# Patient Record
Sex: Male | Born: 1970 | Race: White | Hispanic: No | Marital: Married | Smoking: Never smoker
Health system: Southern US, Community
[De-identification: ages and names within clinical notes are randomized; demographics above are authoritative.]

## PROBLEM LIST (undated history)

## (undated) DIAGNOSIS — Z85820 Personal history of malignant melanoma of skin: Secondary | ICD-10-CM

## (undated) DIAGNOSIS — M109 Gout, unspecified: Secondary | ICD-10-CM

## (undated) DIAGNOSIS — M1990 Unspecified osteoarthritis, unspecified site: Secondary | ICD-10-CM

## (undated) DIAGNOSIS — J309 Allergic rhinitis, unspecified: Secondary | ICD-10-CM

## (undated) DIAGNOSIS — N201 Calculus of ureter: Secondary | ICD-10-CM

## (undated) DIAGNOSIS — Z973 Presence of spectacles and contact lenses: Secondary | ICD-10-CM

## (undated) DIAGNOSIS — I10 Essential (primary) hypertension: Secondary | ICD-10-CM

## (undated) DIAGNOSIS — Z87442 Personal history of urinary calculi: Secondary | ICD-10-CM

## (undated) DIAGNOSIS — J4520 Mild intermittent asthma, uncomplicated: Secondary | ICD-10-CM

## (undated) DIAGNOSIS — E785 Hyperlipidemia, unspecified: Secondary | ICD-10-CM

## (undated) DIAGNOSIS — K219 Gastro-esophageal reflux disease without esophagitis: Secondary | ICD-10-CM

## (undated) DIAGNOSIS — R3915 Urgency of urination: Secondary | ICD-10-CM

## (undated) HISTORY — DX: Gastro-esophageal reflux disease without esophagitis: K21.9

## (undated) HISTORY — DX: Gout, unspecified: M10.9

## (undated) HISTORY — DX: Essential (primary) hypertension: I10

## (undated) HISTORY — PX: KNEE ARTHROSCOPY: SUR90

---

## 1990-11-10 HISTORY — PX: OTHER SURGICAL HISTORY: SHX169

## 1999-12-26 ENCOUNTER — Ambulatory Visit (HOSPITAL_COMMUNITY): Admission: RE | Admit: 1999-12-26 | Discharge: 1999-12-26 | Payer: Self-pay | Admitting: Neurosurgery

## 1999-12-26 ENCOUNTER — Encounter: Payer: Self-pay | Admitting: Neurosurgery

## 2000-01-09 ENCOUNTER — Ambulatory Visit (HOSPITAL_COMMUNITY): Admission: RE | Admit: 2000-01-09 | Discharge: 2000-01-09 | Payer: Self-pay | Admitting: Neurosurgery

## 2000-01-09 ENCOUNTER — Encounter: Payer: Self-pay | Admitting: Neurosurgery

## 2000-01-27 ENCOUNTER — Encounter: Payer: Self-pay | Admitting: Neurosurgery

## 2000-01-27 ENCOUNTER — Ambulatory Visit (HOSPITAL_COMMUNITY): Admission: RE | Admit: 2000-01-27 | Discharge: 2000-01-27 | Payer: Self-pay | Admitting: Neurosurgery

## 2005-11-10 DIAGNOSIS — Z85820 Personal history of malignant melanoma of skin: Secondary | ICD-10-CM

## 2005-11-10 HISTORY — DX: Personal history of malignant melanoma of skin: Z85.820

## 2006-06-26 ENCOUNTER — Encounter (INDEPENDENT_AMBULATORY_CARE_PROVIDER_SITE_OTHER): Payer: Self-pay | Admitting: Pathology

## 2006-06-26 ENCOUNTER — Ambulatory Visit (HOSPITAL_BASED_OUTPATIENT_CLINIC_OR_DEPARTMENT_OTHER): Admission: RE | Admit: 2006-06-26 | Discharge: 2006-06-26 | Payer: Self-pay | Admitting: General Surgery

## 2006-06-26 HISTORY — PX: OTHER SURGICAL HISTORY: SHX169

## 2006-10-13 ENCOUNTER — Ambulatory Visit: Payer: Self-pay | Admitting: Family Medicine

## 2008-10-23 ENCOUNTER — Encounter: Admission: RE | Admit: 2008-10-23 | Discharge: 2008-10-23 | Payer: Self-pay | Admitting: Family Medicine

## 2008-11-06 ENCOUNTER — Ambulatory Visit: Payer: Self-pay | Admitting: Pulmonary Disease

## 2008-11-06 DIAGNOSIS — Z85820 Personal history of malignant melanoma of skin: Secondary | ICD-10-CM | POA: Insufficient documentation

## 2008-11-06 DIAGNOSIS — I10 Essential (primary) hypertension: Secondary | ICD-10-CM | POA: Insufficient documentation

## 2008-11-06 DIAGNOSIS — E785 Hyperlipidemia, unspecified: Secondary | ICD-10-CM

## 2008-11-06 DIAGNOSIS — R05 Cough: Secondary | ICD-10-CM

## 2008-11-06 DIAGNOSIS — R059 Cough, unspecified: Secondary | ICD-10-CM | POA: Insufficient documentation

## 2009-01-08 ENCOUNTER — Telehealth (INDEPENDENT_AMBULATORY_CARE_PROVIDER_SITE_OTHER): Payer: Self-pay | Admitting: *Deleted

## 2009-06-08 ENCOUNTER — Emergency Department (HOSPITAL_COMMUNITY): Admission: EM | Admit: 2009-06-08 | Discharge: 2009-06-08 | Payer: Self-pay | Admitting: Family Medicine

## 2009-11-06 ENCOUNTER — Encounter: Admission: RE | Admit: 2009-11-06 | Discharge: 2009-11-06 | Payer: Self-pay | Admitting: Family Medicine

## 2011-01-15 ENCOUNTER — Other Ambulatory Visit: Payer: Self-pay | Admitting: Family Medicine

## 2011-01-15 ENCOUNTER — Ambulatory Visit
Admission: RE | Admit: 2011-01-15 | Discharge: 2011-01-15 | Disposition: A | Discharge status: Home / Self Care | Payer: BC Managed Care – PPO | Source: Ambulatory Visit | Attending: Family Medicine | Admitting: Family Medicine

## 2011-01-15 DIAGNOSIS — C439 Malignant melanoma of skin, unspecified: Secondary | ICD-10-CM

## 2011-03-28 NOTE — Op Note (Signed)
NAME:  Isaac Summers, Isaac Summers NO.:  [id number]   MEDICAL RECORD NO.:  [id number]          PATIENT TYPE:  AMB   LOCATION:  DSC                          FACILITY:  [facility]   PHYSICIAN:  Angelia Mould. Derrell Lolling, M.D.DATE OF BIRTH:  1971-05-27   DATE OF PROCEDURE:  06/26/2006  DATE OF DISCHARGE:                                 OPERATIVE REPORT   PREOPERATIVE DIAGNOSIS:  Melanoma, left chest wall.   POSTOPERATIVE DIAGNOSIS:  Melanoma, left chest wall.   OPERATION PERFORMED:  Wide local excision, melanoma, left chest wall.   SURGEON:  Angelia Mould. Derrell Lolling, M.D.   OPERATIVE INDICATIONS:  This is a 40 year old white male who noticed a  pigmented nevus on his left upper anterior chest wall just below the deltoid  muscle.  Dr. Tresa Res performed a shave biopsy on this recently, and  this revealed a superficial spreading melanoma, Breslow's measurement 0.50  mm, no ulceration, involved peripheral margins were noted.  Clark's level  II.  He has been evaluated in the office.  There is no adenopathy or signs  of any satellitosis.  He is brought to the operating room for wide local  excision.   OPERATIVE TECHNIQUE:  The patient was placed supine on the operating table  with his left arm at his side.  He was monitored and sedated by anesthesia  department.  The left anterior chest wall and shoulder were prepped and  draped in sterile fashion.   I observed the 1-cm shallow ulcer just below the left deltoid muscle.  I  measured margins of 1.4 cm in all directions and marked this.  I then marked  an oblique elliptical incision to encompass the intended margins.  Local  infiltration anesthetic with 1% Xylocaine with epinephrine was used.  The  oblique elliptical incision was made with a knife, and dissection was  carried down through subcutaneous tissue all the way to the muscle fascia  using the cautery.  The specimen was marked with silk sutures to orient the  pathologist to the margins,  and the specimen was sent for routine histology.  Hemostasis was excellent and achieved with electrocautery.  The subcutaneous  tissue was closed with 5 interrupted sutures of 3-0 Vicryl and skin closed  with running subcuticular suture of 4-0 Monocryl and Steri-Strips.  Clean  bandages were placed and the patient taken recovery room in stable  condition.  Estimated blood loss was about 5 mL.  Complications none.  Sponge, needle and instrument counts were correct.      Angelia Mould. Derrell Lolling, M.D.  Electronically Signed     HMI/MEDQ  D:  06/26/2006  T:  06/26/2006  Job:  [id number]

## 2011-07-22 ENCOUNTER — Other Ambulatory Visit: Payer: Self-pay | Admitting: Physician Assistant

## 2012-10-06 ENCOUNTER — Ambulatory Visit
Admission: RE | Admit: 2012-10-06 | Discharge: 2012-10-06 | Disposition: A | Discharge status: Home / Self Care | Payer: BC Managed Care – PPO | Source: Ambulatory Visit | Attending: Family Medicine | Admitting: Family Medicine

## 2012-10-06 ENCOUNTER — Other Ambulatory Visit: Payer: Self-pay | Admitting: Family Medicine

## 2012-10-06 DIAGNOSIS — Z0000 Encounter for general adult medical examination without abnormal findings: Secondary | ICD-10-CM

## 2014-05-19 ENCOUNTER — Other Ambulatory Visit: Payer: Self-pay | Admitting: Physician Assistant

## 2014-10-04 ENCOUNTER — Ambulatory Visit
Admission: RE | Admit: 2014-10-04 | Discharge: 2014-10-04 | Disposition: A | Discharge status: Home / Self Care | Payer: BC Managed Care – PPO | Source: Ambulatory Visit | Attending: Family Medicine | Admitting: Family Medicine

## 2014-10-04 ENCOUNTER — Other Ambulatory Visit: Payer: Self-pay | Admitting: Family Medicine

## 2014-10-04 DIAGNOSIS — Z85820 Personal history of malignant melanoma of skin: Secondary | ICD-10-CM

## 2017-03-12 ENCOUNTER — Ambulatory Visit (INDEPENDENT_AMBULATORY_CARE_PROVIDER_SITE_OTHER): Payer: Managed Care, Other (non HMO) | Admitting: Sports Medicine

## 2017-03-12 ENCOUNTER — Encounter (INDEPENDENT_AMBULATORY_CARE_PROVIDER_SITE_OTHER): Payer: Self-pay

## 2017-03-12 ENCOUNTER — Encounter: Payer: Self-pay | Admitting: Sports Medicine

## 2017-03-12 DIAGNOSIS — M25579 Pain in unspecified ankle and joints of unspecified foot: Secondary | ICD-10-CM

## 2017-03-12 NOTE — Assessment & Plan Note (Signed)
Patient was fitted for a : standard, cushioned, semi-rigid orthotic. The orthotic was heated and afterward the patient stood on the orthotic blank positioned on the orthotic stand. The patient was positioned in subtalar neutral position and 10 degrees of ankle dorsiflexion in a weight bearing stance. After completion of molding, a stable base was applied to the orthotic blank. The blank was ground to a stable position for weight bearing. Size: 11 red EVA Base: med. density blue EVA Posting: none Additional orthotic padding:  None  I spent30 minutes with this patient. Over 50% of visit was spend in counseling and coordination of care for problems with use of orthotics for chronic foot pain.

## 2017-03-12 NOTE — Progress Notes (Signed)
CC: Bilat foot pain  Patient had hx of bilateral foot pain All along arch  Hurt with golf or too much walking Put into orthotics 5 to 6 years ago These really help Those orthotics are wearing out Tried to play without orthotics and pain returned  Past Hx HTN and HLD Remote hx of melanoma  ROS No foot swelling No numbness  PE Pleasant M in NAD BP 139/87   Ht 5\' 8"  (1.727 m)   Wt 212 lb (96.2 kg)   BMI 32.23 kg/m   Bilat feet show loss of long arch Standing pronation at mid foot Walking shows biklat pronation No TTP Mild loss of transverse arch No abn. Calluses

## 2017-06-19 ENCOUNTER — Observation Stay (HOSPITAL_COMMUNITY)
Admission: AD | Admit: 2017-06-19 | Discharge: 2017-06-21 | Disposition: A | Discharge status: Home / Self Care | Payer: Managed Care, Other (non HMO) | Source: Ambulatory Visit | Attending: Urology | Admitting: Urology

## 2017-06-19 ENCOUNTER — Inpatient Hospital Stay (HOSPITAL_COMMUNITY): Payer: Managed Care, Other (non HMO) | Admitting: Anesthesiology

## 2017-06-19 ENCOUNTER — Encounter (HOSPITAL_COMMUNITY): Payer: Self-pay | Admitting: *Deleted

## 2017-06-19 ENCOUNTER — Encounter (HOSPITAL_COMMUNITY)
Admission: AD | Disposition: A | Discharge status: Home / Self Care | Payer: Self-pay | Source: Ambulatory Visit | Attending: Urology

## 2017-06-19 ENCOUNTER — Other Ambulatory Visit: Payer: Self-pay | Admitting: Urology

## 2017-06-19 ENCOUNTER — Inpatient Hospital Stay (HOSPITAL_COMMUNITY): Payer: Managed Care, Other (non HMO)

## 2017-06-19 DIAGNOSIS — Z6832 Body mass index (BMI) 32.0-32.9, adult: Secondary | ICD-10-CM | POA: Insufficient documentation

## 2017-06-19 DIAGNOSIS — Z85820 Personal history of malignant melanoma of skin: Secondary | ICD-10-CM | POA: Insufficient documentation

## 2017-06-19 DIAGNOSIS — N132 Hydronephrosis with renal and ureteral calculous obstruction: Principal | ICD-10-CM | POA: Insufficient documentation

## 2017-06-19 DIAGNOSIS — N201 Calculus of ureter: Secondary | ICD-10-CM | POA: Diagnosis present

## 2017-06-19 DIAGNOSIS — R000 Tachycardia, unspecified: Secondary | ICD-10-CM | POA: Insufficient documentation

## 2017-06-19 DIAGNOSIS — Z7982 Long term (current) use of aspirin: Secondary | ICD-10-CM | POA: Diagnosis not present

## 2017-06-19 DIAGNOSIS — M109 Gout, unspecified: Secondary | ICD-10-CM | POA: Diagnosis not present

## 2017-06-19 DIAGNOSIS — E785 Hyperlipidemia, unspecified: Secondary | ICD-10-CM | POA: Diagnosis not present

## 2017-06-19 DIAGNOSIS — N390 Urinary tract infection, site not specified: Secondary | ICD-10-CM | POA: Insufficient documentation

## 2017-06-19 DIAGNOSIS — I10 Essential (primary) hypertension: Secondary | ICD-10-CM | POA: Diagnosis not present

## 2017-06-19 DIAGNOSIS — Z79899 Other long term (current) drug therapy: Secondary | ICD-10-CM | POA: Insufficient documentation

## 2017-06-19 DIAGNOSIS — Z8249 Family history of ischemic heart disease and other diseases of the circulatory system: Secondary | ICD-10-CM | POA: Insufficient documentation

## 2017-06-19 DIAGNOSIS — K219 Gastro-esophageal reflux disease without esophagitis: Secondary | ICD-10-CM | POA: Insufficient documentation

## 2017-06-19 HISTORY — PX: CYSTOSCOPY W/ URETERAL STENT PLACEMENT: SHX1429

## 2017-06-19 LAB — BASIC METABOLIC PANEL
ANION GAP: 11 (ref 5–15)
BUN: 18 mg/dL (ref 6–20)
CALCIUM: 8.6 mg/dL — AB (ref 8.9–10.3)
CO2: 25 mmol/L (ref 22–32)
Chloride: 101 mmol/L (ref 101–111)
Creatinine, Ser: 1.81 mg/dL — ABNORMAL HIGH (ref 0.61–1.24)
GFR calc Af Amer: 50 mL/min — ABNORMAL LOW (ref >60)
GFR, EST NON AFRICAN AMERICAN: 43 mL/min — AB (ref >60)
Glucose, Bld: 111 mg/dL — ABNORMAL HIGH (ref 65–99)
Potassium: 4.2 mmol/L (ref 3.5–5.1)
Sodium: 137 mmol/L (ref 135–145)

## 2017-06-19 LAB — CBC
HCT: 36.4 % — ABNORMAL LOW (ref 39.0–52.0)
Hemoglobin: 12.4 g/dL — ABNORMAL LOW (ref 13.0–17.0)
MCH: 31.2 pg (ref 26.0–34.0)
MCHC: 34.1 g/dL (ref 30.0–36.0)
MCV: 91.5 fL (ref 78.0–100.0)
PLATELETS: 152 10*3/uL (ref 150–400)
RBC: 3.98 MIL/uL — ABNORMAL LOW (ref 4.22–5.81)
RDW: 13.6 % (ref 11.5–15.5)
WBC: 7.9 10*3/uL (ref 4.0–10.5)

## 2017-06-19 SURGERY — CYSTOSCOPY, WITH RETROGRADE PYELOGRAM AND URETERAL STENT INSERTION
Anesthesia: General | Laterality: Left

## 2017-06-19 MED ORDER — FENTANYL CITRATE (PF) 100 MCG/2ML IJ SOLN
25.0000 ug | INTRAMUSCULAR | Status: DC | PRN
  Administered 2017-06-19: 25 ug via INTRAVENOUS

## 2017-06-19 MED ORDER — FENTANYL CITRATE (PF) 100 MCG/2ML IJ SOLN
INTRAMUSCULAR | Status: AC
  Filled 2017-06-19: qty 2

## 2017-06-19 MED ORDER — DEXTROSE 5 % IV SOLN
INTRAVENOUS | Status: AC
  Filled 2017-06-19: qty 2

## 2017-06-19 MED ORDER — DEXAMETHASONE SODIUM PHOSPHATE 10 MG/ML IJ SOLN
INTRAMUSCULAR | Status: AC
  Filled 2017-06-19: qty 1

## 2017-06-19 MED ORDER — LACTATED RINGERS IV SOLN
INTRAVENOUS | Status: DC | PRN
  Administered 2017-06-19 (×2): via INTRAVENOUS

## 2017-06-19 MED ORDER — ONDANSETRON HCL 4 MG/2ML IJ SOLN: 4.0000 mg | INTRAMUSCULAR | Status: DC | PRN

## 2017-06-19 MED ORDER — PROPOFOL 10 MG/ML IV BOLUS
INTRAVENOUS | Status: DC | PRN
  Administered 2017-06-19: 200 mg via INTRAVENOUS

## 2017-06-19 MED ORDER — ZOLPIDEM TARTRATE 5 MG PO TABS
5.0000 mg | ORAL_TABLET | Freq: Every evening | ORAL | Status: DC | PRN
  Administered 2017-06-19 – 2017-06-20 (×2): 5 mg via ORAL
  Filled 2017-06-19 (×3): qty 1

## 2017-06-19 MED ORDER — FENTANYL CITRATE (PF) 100 MCG/2ML IJ SOLN
INTRAMUSCULAR | Status: DC | PRN
  Administered 2017-06-19 (×2): 50 ug via INTRAVENOUS

## 2017-06-19 MED ORDER — SERTRALINE HCL 50 MG PO TABS
50.0000 mg | ORAL_TABLET | Freq: Every day | ORAL | Status: DC
  Administered 2017-06-20 – 2017-06-21 (×2): 50 mg via ORAL
  Filled 2017-06-19 (×2): qty 1

## 2017-06-19 MED ORDER — MIDAZOLAM HCL 5 MG/5ML IJ SOLN
INTRAMUSCULAR | Status: DC | PRN
  Administered 2017-06-19: 2 mg via INTRAVENOUS

## 2017-06-19 MED ORDER — CEFAZOLIN SODIUM-DEXTROSE 2-4 GM/100ML-% IV SOLN
2.0000 g | INTRAVENOUS | Status: AC
  Administered 2017-06-19: 2 g via INTRAVENOUS
  Filled 2017-06-19: qty 100

## 2017-06-19 MED ORDER — FEBUXOSTAT 40 MG PO TABS
40.0000 mg | ORAL_TABLET | Freq: Every day | ORAL | Status: DC
  Administered 2017-06-20 – 2017-06-21 (×2): 40 mg via ORAL
  Filled 2017-06-19 (×2): qty 1

## 2017-06-19 MED ORDER — LIDOCAINE 2% (20 MG/ML) 5 ML SYRINGE
INTRAMUSCULAR | Status: DC | PRN
  Administered 2017-06-19: 100 mg via INTRAVENOUS

## 2017-06-19 MED ORDER — DEXTROSE 5 % IV SOLN
2.0000 g | Freq: Once | INTRAVENOUS | Status: AC
  Administered 2017-06-19: 2 g via INTRAVENOUS

## 2017-06-19 MED ORDER — MIDAZOLAM HCL 2 MG/2ML IJ SOLN
INTRAMUSCULAR | Status: AC
  Filled 2017-06-19: qty 2

## 2017-06-19 MED ORDER — LACTATED RINGERS IV SOLN: INTRAVENOUS | Status: DC

## 2017-06-19 MED ORDER — DIPHENHYDRAMINE HCL 50 MG/ML IJ SOLN: 12.5000 mg | Freq: Four times a day (QID) | INTRAMUSCULAR | Status: DC | PRN

## 2017-06-19 MED ORDER — PROPOFOL 10 MG/ML IV BOLUS
INTRAVENOUS | Status: AC
  Filled 2017-06-19: qty 20

## 2017-06-19 MED ORDER — LACTATED RINGERS IV SOLN
Freq: Once | INTRAVENOUS | Status: AC
  Administered 2017-06-19: 17:00:00 via INTRAVENOUS

## 2017-06-19 MED ORDER — OXYCODONE-ACETAMINOPHEN 5-325 MG PO TABS: 1.0000 | ORAL_TABLET | ORAL | Status: DC | PRN

## 2017-06-19 MED ORDER — PHENYLEPHRINE 40 MCG/ML (10ML) SYRINGE FOR IV PUSH (FOR BLOOD PRESSURE SUPPORT)
PREFILLED_SYRINGE | INTRAVENOUS | Status: AC
  Filled 2017-06-19: qty 10

## 2017-06-19 MED ORDER — BELLADONNA-OPIUM 16.2-30 MG RE SUPP
1.0000 | Freq: Four times a day (QID) | RECTAL | Status: DC | PRN
  Administered 2017-06-19: 1 via RECTAL
  Filled 2017-06-19 (×2): qty 1

## 2017-06-19 MED ORDER — ALBUTEROL SULFATE (2.5 MG/3ML) 0.083% IN NEBU: 3.0000 mL | INHALATION_SOLUTION | Freq: Four times a day (QID) | RESPIRATORY_TRACT | Status: DC | PRN

## 2017-06-19 MED ORDER — SODIUM CHLORIDE 0.9 % IV SOLN
INTRAVENOUS | Status: DC
  Administered 2017-06-19 – 2017-06-20 (×2): via INTRAVENOUS

## 2017-06-19 MED ORDER — IRBESARTAN 75 MG PO TABS
37.5000 mg | ORAL_TABLET | Freq: Every day | ORAL | Status: DC
  Administered 2017-06-20 – 2017-06-21 (×2): 37.5 mg via ORAL
  Filled 2017-06-19 (×2): qty 0.5

## 2017-06-19 MED ORDER — TAMSULOSIN HCL 0.4 MG PO CAPS
0.4000 mg | ORAL_CAPSULE | Freq: Every day | ORAL | Status: DC
  Administered 2017-06-20 – 2017-06-21 (×2): 0.4 mg via ORAL
  Filled 2017-06-19 (×2): qty 1

## 2017-06-19 MED ORDER — ONDANSETRON HCL 4 MG/2ML IJ SOLN
INTRAMUSCULAR | Status: DC | PRN
  Administered 2017-06-19: 4 mg via INTRAVENOUS

## 2017-06-19 MED ORDER — SODIUM CHLORIDE 0.9 % IV SOLN
INTRAVENOUS | Status: DC | PRN
  Administered 2017-06-19: 50 mL

## 2017-06-19 MED ORDER — DEXAMETHASONE SODIUM PHOSPHATE 10 MG/ML IJ SOLN
INTRAMUSCULAR | Status: DC | PRN
  Administered 2017-06-19: 10 mg via INTRAVENOUS

## 2017-06-19 MED ORDER — HYDROMORPHONE HCL-NACL 0.5-0.9 MG/ML-% IV SOSY: 0.5000 mg | PREFILLED_SYRINGE | INTRAVENOUS | Status: DC | PRN

## 2017-06-19 MED ORDER — PHENYLEPHRINE 40 MCG/ML (10ML) SYRINGE FOR IV PUSH (FOR BLOOD PRESSURE SUPPORT)
PREFILLED_SYRINGE | INTRAVENOUS | Status: DC | PRN
  Administered 2017-06-19 (×2): 80 ug via INTRAVENOUS

## 2017-06-19 MED ORDER — ONDANSETRON HCL 4 MG/2ML IJ SOLN
INTRAMUSCULAR | Status: AC
  Filled 2017-06-19: qty 2

## 2017-06-19 MED ORDER — DIPHENHYDRAMINE HCL 12.5 MG/5ML PO ELIX: 12.5000 mg | ORAL_SOLUTION | Freq: Four times a day (QID) | ORAL | Status: DC | PRN

## 2017-06-19 MED ORDER — ATORVASTATIN CALCIUM 10 MG PO TABS
10.0000 mg | ORAL_TABLET | Freq: Every day | ORAL | Status: DC
  Administered 2017-06-20 – 2017-06-21 (×2): 10 mg via ORAL
  Filled 2017-06-19 (×2): qty 1

## 2017-06-19 MED ORDER — ASPIRIN EC 81 MG PO TBEC
81.0000 mg | DELAYED_RELEASE_TABLET | Freq: Every day | ORAL | Status: DC
  Administered 2017-06-20 – 2017-06-21 (×2): 81 mg via ORAL
  Filled 2017-06-19 (×2): qty 1

## 2017-06-19 MED ORDER — DEXTROSE 5 % IV SOLN
2.0000 g | INTRAVENOUS | Status: DC
  Administered 2017-06-20 – 2017-06-21 (×2): 2 g via INTRAVENOUS
  Filled 2017-06-19 (×2): qty 2

## 2017-06-19 SURGICAL SUPPLY — 29 items
BAG URO CATCHER STRL LF (MISCELLANEOUS) ×3 IMPLANT
BASKET DAKOTA 1.9FR 11X120 (BASKET) IMPLANT
BASKET LASER NITINOL 1.9FR (BASKET) IMPLANT
CATH FOLEY 2WAY SLVR  5CC 16FR (CATHETERS) ×2
CATH FOLEY 2WAY SLVR 5CC 16FR (CATHETERS) ×1 IMPLANT
CATH FOLEY LATEX FREE 20FR (CATHETERS)
CATH FOLEY LF 20FR (CATHETERS) IMPLANT
CATH INTERMIT  6FR 70CM (CATHETERS) ×3 IMPLANT
CLOTH BEACON ORANGE TIMEOUT ST (SAFETY) ×3 IMPLANT
COVER SURGICAL LIGHT HANDLE (MISCELLANEOUS) ×3 IMPLANT
EXTRACTOR STONE NITINOL NGAGE (UROLOGICAL SUPPLIES) IMPLANT
FIBER LASER TRAC TIP (UROLOGICAL SUPPLIES) IMPLANT
GLOVE BIO SURGEON STRL SZ8 (GLOVE) ×3 IMPLANT
GOWN STRL REUS W/TWL XL LVL3 (GOWN DISPOSABLE) ×3 IMPLANT
GUIDEWIRE ANG ZIPWIRE 038X150 (WIRE) ×3 IMPLANT
GUIDEWIRE STR DUAL SENSOR (WIRE) ×3 IMPLANT
IV NS 1000ML (IV SOLUTION) ×2
IV NS 1000ML BAXH (IV SOLUTION) ×1 IMPLANT
MANIFOLD NEPTUNE II (INSTRUMENTS) ×3 IMPLANT
PACK CYSTO (CUSTOM PROCEDURE TRAY) ×3 IMPLANT
SHEATH ACCESS URETERAL 38CM (SHEATH) IMPLANT
STENT CONTOUR 6FRX26X.038 (STENTS) IMPLANT
STENT URET 6FRX26 CONTOUR (STENTS) ×3 IMPLANT
SYR CONTROL 10ML LL (SYRINGE) IMPLANT
SYRINGE IRR TOOMEY STRL 70CC (SYRINGE) IMPLANT
TRAY FOLEY BAG SILVER LF 16FR (SET/KITS/TRAYS/PACK) ×3 IMPLANT
TUBE FEEDING 8FR 16IN STR KANG (MISCELLANEOUS) IMPLANT
TUBING CONNECTING 10 (TUBING) ×2 IMPLANT
TUBING CONNECTING 10' (TUBING) ×1

## 2017-06-19 NOTE — Anesthesia Preprocedure Evaluation (Signed)
Anesthesia Evaluation  Patient identified by MRN, date of birth, ID band Patient awake    Reviewed: Allergy & Precautions, NPO status , Patient's Chart, lab work & pertinent test results  History of Anesthesia Complications Negative for: history of anesthetic complications  Airway Mallampati: III  TM Distance: >3 FB Neck ROM: Full    Dental  (+) Teeth Intact   Pulmonary neg shortness of breath, neg sleep apnea, neg recent URI,  Seasonal allergies with prn use of inhaler   breath sounds clear to auscultation       Cardiovascular hypertension, Pt. on medications  Rhythm:Regular Rate:Tachycardia     Neuro/Psych negative neurological ROS     GI/Hepatic Neg liver ROS, GERD  Controlled,  Endo/Other  negative endocrine ROSMorbid obesity  Renal/GU negative Renal ROSKidney stone     Musculoskeletal   Abdominal   Peds  Hematology negative hematology ROS (+)   Anesthesia Other Findings febrile  Reproductive/Obstetrics                             Anesthesia Physical Anesthesia Plan  ASA: II and emergent  Anesthesia Plan: General   Post-op Pain Management:    Induction: Intravenous  PONV Risk Score and Plan: 2 and Ondansetron and Dexamethasone  Airway Management Planned: LMA  Additional Equipment: None  Intra-op Plan:   Post-operative Plan: Extubation in OR  Informed Consent: I have reviewed the patients History and Physical, chart, labs and discussed the procedure including the risks, benefits and alternatives for the proposed anesthesia with the patient or authorized representative who has indicated his/her understanding and acceptance.   Dental advisory given  Plan Discussed with: CRNA and Surgeon  Anesthesia Plan Comments:         Anesthesia Quick Evaluation

## 2017-06-19 NOTE — Anesthesia Procedure Notes (Signed)
Procedure Name: LMA Insertion Date/Time: 06/19/2017 5:53 PM Performed by: Noralyn Pick D Pre-anesthesia Checklist: Patient identified, Emergency Drugs available, Suction available and Patient being monitored Patient Re-evaluated:Patient Re-evaluated prior to induction Oxygen Delivery Method: Circle system utilized Preoxygenation: Pre-oxygenation with 100% oxygen Induction Type: IV induction Ventilation: Mask ventilation without difficulty LMA: LMA inserted LMA Size: 4.0 Tube type: Oral Number of attempts: 1 Placement Confirmation: ETT inserted through vocal cords under direct vision,  positive ETCO2 and breath sounds checked- equal and bilateral Tube secured with: Tape Dental Injury: Teeth and Oropharynx as per pre-operative assessment

## 2017-06-19 NOTE — Op Note (Signed)
.  Preoperative diagnosis: Left ureteral stone, sepsis  Postoperative diagnosis: Same  Procedure: 1 cystoscopy 2. Left retrograde pyelography 3.  Intraoperative fluoroscopy, under one hour, with interpretation 4. Left 6 x 26 JJ stent placement  Attending: Nicolette Bang  Anesthesia: General  Estimated blood loss: None  Drains: Left 6 x 26 JJ ureteral stent without tether, 16 French foley catheter  Specimens: none  Antibiotics: rocephin   Findings: left proximal ureteral stone. Moderate hydronephrosis. No masses/lesions in the bladder. Ureteral orifices in normal anatomic location.  Indications: Patient is a 46 year old male with a history of left ureteral stone and concern for sepsis.  After discussing treatment options, they decided proceed with left stent placement.  Procedure her in detail: The patient was brought to the operating room and a brief timeout was done to ensure correct patient, correct procedure, correct site.  General anesthesia was administered patient was placed in dorsal lithotomy position.  Their genitalia was then prepped and draped in usual sterile fashion.  A rigid 59 French cystoscope was passed in the urethra and the bladder.  Bladder was inspected free masses or lesions.  the ureteral orifices were in the normal orthotopic locations.  a 6 french ureteral catheter was then instilled into the left ureteral orifice.  a gentle retrograde was obtained and findings noted above.  we then placed a zip wire through the ureteral catheter and advanced up to the renal pelvis.    We then placed a 6 x 26 double-j ureteral stent over the original zip wire.  We then removed the wire and good coil was noted in the the renal pelvis under fluoroscopy and the bladder under direct vision.  A foley catheter was then placed. the bladder was then drained and this concluded the procedure which was well tolerated by patient.  Complications: None  Condition: Stable, extubated,  transferred to PACU  Plan: Patient is to be admitted for IV antibiotics. He will have his stone extraction in 2 weeks.

## 2017-06-19 NOTE — Transfer of Care (Signed)
Immediate Anesthesia Transfer of Care Note  Patient: Isaac Summers  Procedure(s) Performed: Procedure(s): CYSTOSCOPY WITH RETROGRADE PYELOGRAM/URETERAL STENT PLACEMENT (Left)  Patient Location: PACU  Anesthesia Type:General  Level of Consciousness: awake, alert  and oriented  Airway & Oxygen Therapy: Patient Spontanous Breathing and Patient connected to face mask oxygen  Post-op Assessment: Report given to RN and Post -op Vital signs reviewed and stable  Post vital signs: Reviewed and stable  Last Vitals:  Vitals:   06/19/17 1650  BP: 116/70  Pulse: (!) 103  Resp: 20  Temp: (!) 38.4 C  SpO2: 95%    Last Pain:  Vitals:   06/19/17 1650  TempSrc: Oral  PainSc: 5       Patients Stated Pain Goal: 4 (38/93/73 4287)  Complications: No apparent anesthesia complications

## 2017-06-19 NOTE — H&P (Signed)
Urology Admission H&P  Chief Complaint: left flank pain, fever  History of Present Illness: Isaac Summers is a 45yo with a hx of nephrolithiasis who presented to my office today with a 1 week history of severe, sharp, constant, nonradiating left flank pain. He developed a fever this morning and is currently 101.5F. He has associated nausea and chills. CT scan shows a 5mm left proximal ureteral calculus with moderate hydronephrosis and perinephric stranding  Past Medical History:  Diagnosis Date  . GERD (gastroesophageal reflux disease)   . Gout   . Hyperlipemia   . Hypertension   . Melanoma (HCC)    excision  left shoulder    Past Surgical History:  Procedure Laterality Date  . LEFT SHOULDER RECONSTRUCTION     1992  . VASECTOMY     2006  . WIDE EXCISION FROM LEFT SHOULDER .     2007  & 2008    Home Medications:  Prescriptions Prior to Admission  Medication Sig Dispense Refill Last Dose  . acetaminophen (TYLENOL) 500 MG tablet Take 1,000 mg by mouth every 6 (six) hours as needed for mild pain or moderate pain.   06/18/2017 at Unknown time  . aspirin EC 81 MG tablet Take 81 mg by mouth daily.   06/19/2017 at Unknown time  . atorvastatin (LIPITOR) 20 MG tablet Take 10 mg by mouth daily.   06/19/2017 at Unknown time  . celecoxib (CELEBREX) 200 MG capsule Take 200 mg by mouth daily as needed for pain.   couple of months at Unknown time  . colchicine 0.6 MG tablet Take 0.6 mg by mouth 2 (two) times daily as needed (gout).    couple of months at Unknown time  . EPINEPHrine 0.3 mg/0.3 mL IJ SOAJ injection Inject 0.3 mg into the muscle as needed for anaphylaxis.   couple of months  . fluticasone (FLONASE) 50 MCG/ACT nasal spray Place 2 sprays into both nostrils 2 (two) times daily as needed for allergies or rhinitis.   2 months ago  . ibuprofen (ADVIL,MOTRIN) 200 MG tablet Take 400 mg by mouth every 6 (six) hours as needed for mild pain or moderate pain.   06/18/2017 at Unknown time  .  olmesartan (BENICAR) 40 MG tablet Take 40 mg by mouth daily.  4 06/19/2017 at Unknown time  . PROAIR HFA 108 (90 Base) MCG/ACT inhaler Take 2 puffs by mouth every 6 (six) hours as needed for wheezing or shortness of breath.   couple of months  . sertraline (ZOLOFT) 50 MG tablet Take 50 mg by mouth daily.   06/19/2017 at Unknown time  . tamsulosin (FLOMAX) 0.4 MG CAPS capsule Take 0.4 mg by mouth daily.   06/19/2017 at Unknown time  . ULORIC 80 MG TABS Take 40 mg by mouth daily.   06/19/2017 at Unknown time   Allergies: No Known Allergies  Family History  Problem Relation Age of Onset  . CVA Mother   . Hypertension Mother   . CAD Father        CAD with multiple stents and recents  cardiac arrest. HTN   . Hypertension Brother    Social History:  reports that he has never smoked. He has never used smokeless tobacco. He reports that he drinks alcohol. He reports that he does not use drugs.  Review of Systems  Constitutional: Positive for chills, fever and malaise/fatigue.  Gastrointestinal: Positive for nausea and vomiting.  Genitourinary: Positive for flank pain.  All other systems reviewed and are negative.     Physical Exam:  Vital signs in last 24 hours: Temp:  [101.2 F (38.4 C)] 101.2 F (38.4 C) (08/10 1650) Pulse Rate:  [103] 103 (08/10 1650) Resp:  [20] 20 (08/10 1650) BP: (116)/(70) 116/70 (08/10 1650) SpO2:  [95 %] 95 % (08/10 1650) Weight:  [97.4 kg (214 lb 12.8 oz)] 97.4 kg (214 lb 12.8 oz) (08/10 1650) Physical Exam  Constitutional: He is oriented to person, place, and time. He appears well-developed and well-nourished.  HENT:  Head: Normocephalic and atraumatic.  Eyes: Pupils are equal, round, and reactive to light. EOM are normal.  Neck: Normal range of motion. No thyromegaly present.  Cardiovascular: Normal rate and regular rhythm.   Respiratory: Effort normal. No respiratory distress.  GI: Soft. He exhibits no distension.  Musculoskeletal: Normal range of motion.  He exhibits no edema.  Neurological: He is alert and oriented to person, place, and time.  Skin: Skin is warm and dry.  Psychiatric: He has a normal mood and affect. His behavior is normal. Judgment and thought content normal.    Laboratory Data:  No results found for this or any previous visit (from the past 24 hour(s)). No results found for this or any previous visit (from the past 240 hour(s)). Creatinine: No results for input(s): CREATININE in the last 168 hours. Baseline Creatinine: unknwon  Impression/Assessment:  45yo with left ureteral calculus, sepsis from a urinary source  Plan:  1. The risks/benefits/alternatives to left ureteral stent placement was explained to the patient and he understands and wishes to proceed with surgery 2. Pt will be admitted for IV antibiotics pending urine culture  Shaliyah Taite 06/19/2017, 5:44 PM      

## 2017-06-20 DIAGNOSIS — N132 Hydronephrosis with renal and ureteral calculous obstruction: Secondary | ICD-10-CM | POA: Diagnosis not present

## 2017-06-20 LAB — CBC
HEMATOCRIT: 39.4 % (ref 39.0–52.0)
Hemoglobin: 13.2 g/dL (ref 13.0–17.0)
MCH: 31.1 pg (ref 26.0–34.0)
MCHC: 33.5 g/dL (ref 30.0–36.0)
MCV: 92.7 fL (ref 78.0–100.0)
PLATELETS: 170 10*3/uL (ref 150–400)
RBC: 4.25 MIL/uL (ref 4.22–5.81)
RDW: 13.7 % (ref 11.5–15.5)
WBC: 7.4 10*3/uL (ref 4.0–10.5)

## 2017-06-20 LAB — BASIC METABOLIC PANEL
Anion gap: 8 (ref 5–15)
BUN: 20 mg/dL (ref 6–20)
CALCIUM: 8.8 mg/dL — AB (ref 8.9–10.3)
CO2: 25 mmol/L (ref 22–32)
Chloride: 106 mmol/L (ref 101–111)
Creatinine, Ser: 1.36 mg/dL — ABNORMAL HIGH (ref 0.61–1.24)
GFR calc Af Amer: >60 mL/min (ref >60)
GLUCOSE: 175 mg/dL — AB (ref 65–99)
Potassium: 4.7 mmol/L (ref 3.5–5.1)
Sodium: 139 mmol/L (ref 135–145)

## 2017-06-20 MED ORDER — PHENAZOPYRIDINE HCL 100 MG PO TABS
100.0000 mg | ORAL_TABLET | Freq: Three times a day (TID) | ORAL | Status: DC | PRN
  Administered 2017-06-20: 100 mg via ORAL
  Filled 2017-06-20 (×2): qty 1

## 2017-06-20 NOTE — Progress Notes (Signed)
Urology Progress Note   1 Day Post-Op  46 yo M w/ hx nephrolithiasis who presented yesterday with left flank pain and fever. CT showed 50mm left proximal ureteral stone, moderate hydronephrosis and perinephric stranding. Taken to operating room 8/10 for left ureteral stent placement and cystourethroscopy.   Subjective: Patient did well overnight. Afebrile, VSS. Became hypotensive immediately after surgery but improved with boluses. 2.1L UOP overnight, foley catheter in place, dark yellow this AM. No PO intake. Cr 1.36, improved from 1.81 on admission. No leukocytosis, hgb 13.2.   Objective: Vital signs in last 24 hours: Temp:  [98 F (36.7 C)-101.2 F (38.4 C)] 98.9 F (37.2 C) (08/11 1121) Pulse Rate:  [76-103] 98 (08/11 1121) Resp:  [16-23] 16 (08/11 1121) BP: (89-136)/(64-92) 136/77 (08/11 1121) SpO2:  [92 %-98 %] 94 % (08/11 1121) Weight:  [97.4 kg (214 lb 12.8 oz)] 97.4 kg (214 lb 12.8 oz) (08/10 1650)  Intake/Output from previous day: 08/10 0701 - 08/11 0700 In: 2868.8 [I.V.:2868.8] Out: 2100 [Urine:2100] Intake/Output this shift: No intake/output data recorded.  Physical Exam:  General: Alert and oriented CV: RRR Lungs: Clear Abdomen: Soft, nontender, non distended GU: Foley with dark urine in tubing  Ext: NT, No erythema  Lab Results:  Recent Labs  06/19/17 1856 06/20/17 0434  HGB 12.4* 13.2  HCT 36.4* 39.4   BMET  Recent Labs  06/19/17 1856 06/20/17 0434  NA 137 139  K 4.2 4.7  CL 101 106  CO2 25 25  GLUCOSE 111* 175*  BUN 18 20  CREATININE 1.81* 1.36*  CALCIUM 8.6* 8.8*     Studies/Results: Dg C-arm 1-60 Min-no Report  Result Date: 06/19/2017 Fluoroscopy was utilized by the requesting physician.  No radiographic interpretation.    Assessment/Plan:  46 y.o. male w/ hx nephrolithiasis, who presented with obstructing left ureteral stone and fever, concern for sepsis. S/p left ureteral stent placement.   - regular diet, continue mIVF -  continue ceftriaxone - continue flomax  - if afebrile this PM will d/c foley catheter  - ambulate, wean O2   Dispo: Likely home tomorrow if continues to be afebrile on ceftriaxone.    LOS: 1 day   Isaac Summers 06/20/2017, 11:25 AM

## 2017-06-20 NOTE — Anesthesia Postprocedure Evaluation (Signed)
Anesthesia Post Note  Patient: Isaac Summers  Procedure(s) Performed: Procedure(s) (LRB): CYSTOSCOPY WITH RETROGRADE PYELOGRAM/URETERAL STENT PLACEMENT (Left)     Patient location during evaluation: PACU Anesthesia Type: General Level of consciousness: awake and alert Pain management: pain level controlled Vital Signs Assessment: post-procedure vital signs reviewed and stable Respiratory status: spontaneous breathing, nonlabored ventilation, respiratory function stable and patient connected to nasal cannula oxygen Cardiovascular status: blood pressure returned to baseline and stable Postop Assessment: no signs of nausea or vomiting Anesthetic complications: no    Last Vitals:  Vitals:   06/19/17 2354 06/20/17 0410  BP: 131/76 115/76  Pulse: 97 76  Resp: 16 16  Temp: 36.7 C 36.7 C  SpO2: 93% 92%    Last Pain:  Vitals:   06/20/17 0410  TempSrc: Oral  PainSc:                  Honestii Marton

## 2017-06-21 ENCOUNTER — Encounter (HOSPITAL_COMMUNITY): Payer: Self-pay | Admitting: Urology

## 2017-06-21 DIAGNOSIS — N132 Hydronephrosis with renal and ureteral calculous obstruction: Secondary | ICD-10-CM | POA: Diagnosis not present

## 2017-06-21 MED ORDER — PHENAZOPYRIDINE HCL 100 MG PO TABS: 100.0000 mg | ORAL_TABLET | Freq: Three times a day (TID) | ORAL | 0 refills | Status: AC | PRN

## 2017-06-21 MED ORDER — CEFPODOXIME PROXETIL 200 MG PO TABS: 200.0000 mg | ORAL_TABLET | Freq: Two times a day (BID) | ORAL | 0 refills | Status: AC

## 2017-06-21 NOTE — Discharge Summary (Signed)
[facility] Discharge Summary  Admit date: 06/19/2017  Discharge date and time: 06/21/17   Discharge to: Home  Discharge Service: Urology  Discharge Attending Physician:  Dr. Nicolette Bang  Discharge  Diagnoses: Ureteral calculus, UTI  Secondary Diagnosis: Active Problems:   Ureteral calculus   OR Procedures: Procedure(s): CYSTOSCOPY WITH RETROGRADE PYELOGRAM/URETERAL STENT PLACEMENT 06/19/2017   Ancillary Procedures: None   Discharge Day Services: The patient was seen and examined by the Urology team both in the morning and immediately prior to discharge.  Vital signs and laboratory values were stable and within normal limits.  The physical exam was benign and unchanged and all surgical wounds were examined.  Discharge instructions were explained and all questions answered.  Subjective  No acute events overnight. Pain Controlled. No fever or chills.  Objective Patient Vitals for the past 8 hrs:  BP Temp Temp src Pulse Resp SpO2  06/21/17 0518 137/83 98 F (36.7 C) Oral 70 19 93 %   No intake/output data recorded.  General Appearance:        No acute distress Lungs:                 Normal work of breathing on room air Heart:                             Regular rate and rhythm Abdomen:                       Soft, non-tender, non-distended  Extremities:                  Warm and well perfused    Hospital Course:  The patient underwent left ureteral stent placement with evaucation of pus like urine on 06/19/2017 for left flank pain, fever and a 40mm left proximal ureteral stone.  The patient tolerated the procedure well, was extubated in the OR, and afterwards was taken to the PACU for routine post-surgical care. When stable the patient was transferred to the floor.     The patient did well postoperatively.  The patient's diet was slowly advanced and at the time of discharge was tolerating a regular diet. Patient was afebrile on POD1 after 24 hours of receiving  ceftriaxone, and foley catheter was removed. Patient was able to void volitionally.  The patient was discharged home 2 Days Post-Op, at which point was tolerating a regular solid diet, was able to void spontaneously, have adequate pain control with P.O. pain medication, and could ambulate without difficulty. He was afebrile on ceftriaxone for the duration of his hospitalization and was transitioned to cefpodoxime at discharge. The patient will complete a 14 day course of cefpodoxime, and follow up for ureteroscopic stone extraction with Dr Alyson Ingles in a couple of weeks.   Condition at Discharge: Improved  Discharge Medications:  Allergies as of 06/21/2017   No Known Allergies     Medication List    TAKE these medications   acetaminophen 500 MG tablet Commonly known as:  TYLENOL Take 1,000 mg by mouth every 6 (six) hours as needed for mild pain or moderate pain.   aspirin EC 81 MG tablet Take 81 mg by mouth daily.   atorvastatin 20 MG tablet Commonly known as:  LIPITOR Take 10 mg by mouth daily.   cefpodoxime 200 MG tablet Commonly known as:  VANTIN Take 1 tablet (200 mg total) by mouth 2 (two) times daily.   celecoxib  200 MG capsule Commonly known as:  CELEBREX Take 200 mg by mouth daily as needed for pain.   colchicine 0.6 MG tablet Take 0.6 mg by mouth 2 (two) times daily as needed (gout).   EPINEPHrine 0.3 mg/0.3 mL Soaj injection Commonly known as:  EPI-PEN Inject 0.3 mg into the muscle as needed for anaphylaxis.   fluticasone 50 MCG/ACT nasal spray Commonly known as:  FLONASE Place 2 sprays into both nostrils 2 (two) times daily as needed for allergies or rhinitis.   ibuprofen 200 MG tablet Commonly known as:  ADVIL,MOTRIN Take 400 mg by mouth every 6 (six) hours as needed for mild pain or moderate pain.   olmesartan 40 MG tablet Commonly known as:  BENICAR Take 40 mg by mouth daily.   phenazopyridine 100 MG tablet Commonly known as:  PYRIDIUM Take 1 tablet  (100 mg total) by mouth 3 (three) times daily as needed (dysuria).   PROAIR HFA 108 (90 Base) MCG/ACT inhaler Generic drug:  albuterol Take 2 puffs by mouth every 6 (six) hours as needed for wheezing or shortness of breath.   sertraline 50 MG tablet Commonly known as:  ZOLOFT Take 50 mg by mouth daily.   tamsulosin 0.4 MG Caps capsule Commonly known as:  FLOMAX Take 0.4 mg by mouth daily.   ULORIC 80 MG Tabs Generic drug:  Febuxostat Take 40 mg by mouth daily.       Pending Test Results: None  Discharge Instructions:  Discharge Instructions    Call MD for:  persistant nausea and vomiting    Complete by:  As directed    Call MD for:  severe uncontrolled pain    Complete by:  As directed    Call MD for:  temperature >100.4    Complete by:  As directed    Diet general    Complete by:  As directed    Discharge instructions    Complete by:  As directed    You may see some blood in the urine and may have some burning with urination for 48-72 hours. You also may notice that you have to urinate more frequently or urgently after your procedure which is normal.  You should call should you develop an inability urinate, fever > 101, persistent nausea and vomiting that prevents you from eating or drinking to stay hydrated.  If you have a stent, you will likely urinate more frequently and urgently until the stent is removed and you may experience some discomfort/pain in the lower abdomen and flank especially when urinating. You may take pain medication prescribed to you if needed for pain. You may also intermittently have blood in the urine until the stent is removed.   Increase activity slowly    Complete by:  As directed

## 2017-06-21 NOTE — Progress Notes (Signed)
Reviewed discharge information with patient and caregiver. Answered all questions. Patient/caregiver able to teach back medications and reasons to contact MD/911. Patient verbalizes importance of urology follow up appointment.  Jodine Muchmore M. Glynn Yepes, RN  

## 2017-06-25 ENCOUNTER — Other Ambulatory Visit: Payer: Self-pay | Admitting: Urology

## 2017-07-09 ENCOUNTER — Encounter (HOSPITAL_BASED_OUTPATIENT_CLINIC_OR_DEPARTMENT_OTHER): Payer: Self-pay | Admitting: *Deleted

## 2017-07-10 ENCOUNTER — Encounter (HOSPITAL_BASED_OUTPATIENT_CLINIC_OR_DEPARTMENT_OTHER): Payer: Self-pay | Admitting: *Deleted

## 2017-07-10 NOTE — Progress Notes (Signed)
NPO AFTER MN.  ARRIVE AT 1000.  NEEDS EKG.  CURRENT LAB RESULTS IN CHART AND EPIC.  WILL TAKE AM MEDS AND NEXIUM DOS W/ SIPS OF WATER.

## 2017-07-16 ENCOUNTER — Ambulatory Visit (HOSPITAL_BASED_OUTPATIENT_CLINIC_OR_DEPARTMENT_OTHER)
Admission: RE | Admit: 2017-07-16 | Discharge: 2017-07-16 | Disposition: A | Discharge status: Home / Self Care | Payer: Managed Care, Other (non HMO) | Source: Ambulatory Visit | Attending: Urology | Admitting: Urology

## 2017-07-16 ENCOUNTER — Other Ambulatory Visit: Payer: Self-pay

## 2017-07-16 ENCOUNTER — Ambulatory Visit (HOSPITAL_BASED_OUTPATIENT_CLINIC_OR_DEPARTMENT_OTHER): Payer: Managed Care, Other (non HMO) | Admitting: Certified Registered"

## 2017-07-16 ENCOUNTER — Encounter (HOSPITAL_BASED_OUTPATIENT_CLINIC_OR_DEPARTMENT_OTHER)
Admission: RE | Disposition: A | Discharge status: Home / Self Care | Payer: Self-pay | Source: Ambulatory Visit | Attending: Urology

## 2017-07-16 ENCOUNTER — Encounter (HOSPITAL_BASED_OUTPATIENT_CLINIC_OR_DEPARTMENT_OTHER): Payer: Self-pay | Admitting: Certified Registered"

## 2017-07-16 DIAGNOSIS — Z85820 Personal history of malignant melanoma of skin: Secondary | ICD-10-CM | POA: Diagnosis not present

## 2017-07-16 DIAGNOSIS — J45909 Unspecified asthma, uncomplicated: Secondary | ICD-10-CM | POA: Insufficient documentation

## 2017-07-16 DIAGNOSIS — Z8249 Family history of ischemic heart disease and other diseases of the circulatory system: Secondary | ICD-10-CM | POA: Diagnosis not present

## 2017-07-16 DIAGNOSIS — N200 Calculus of kidney: Secondary | ICD-10-CM | POA: Insufficient documentation

## 2017-07-16 DIAGNOSIS — I10 Essential (primary) hypertension: Secondary | ICD-10-CM | POA: Diagnosis not present

## 2017-07-16 DIAGNOSIS — Z79899 Other long term (current) drug therapy: Secondary | ICD-10-CM | POA: Diagnosis not present

## 2017-07-16 DIAGNOSIS — M109 Gout, unspecified: Secondary | ICD-10-CM | POA: Diagnosis not present

## 2017-07-16 DIAGNOSIS — K219 Gastro-esophageal reflux disease without esophagitis: Secondary | ICD-10-CM | POA: Diagnosis not present

## 2017-07-16 DIAGNOSIS — Z7982 Long term (current) use of aspirin: Secondary | ICD-10-CM | POA: Insufficient documentation

## 2017-07-16 DIAGNOSIS — E785 Hyperlipidemia, unspecified: Secondary | ICD-10-CM | POA: Diagnosis not present

## 2017-07-16 DIAGNOSIS — M1990 Unspecified osteoarthritis, unspecified site: Secondary | ICD-10-CM | POA: Diagnosis not present

## 2017-07-16 HISTORY — PX: CYSTOSCOPY W/ RETROGRADES: SHX1426

## 2017-07-16 HISTORY — PX: CYSTOSCOPY/URETEROSCOPY/HOLMIUM LASER/STENT PLACEMENT: SHX6546

## 2017-07-16 HISTORY — DX: Unspecified osteoarthritis, unspecified site: M19.90

## 2017-07-16 HISTORY — DX: Mild intermittent asthma, uncomplicated: J45.20

## 2017-07-16 HISTORY — DX: Calculus of ureter: N20.1

## 2017-07-16 HISTORY — DX: Urgency of urination: R39.15

## 2017-07-16 HISTORY — DX: Allergic rhinitis, unspecified: J30.9

## 2017-07-16 HISTORY — DX: Personal history of urinary calculi: Z87.442

## 2017-07-16 HISTORY — DX: Personal history of malignant melanoma of skin: Z85.820

## 2017-07-16 HISTORY — DX: Hyperlipidemia, unspecified: E78.5

## 2017-07-16 HISTORY — DX: Presence of spectacles and contact lenses: Z97.3

## 2017-07-16 SURGERY — CYSTOSCOPY/URETEROSCOPY/HOLMIUM LASER/STENT PLACEMENT
Anesthesia: General | Site: Renal | Laterality: Left

## 2017-07-16 MED ORDER — HYDROMORPHONE HCL 1 MG/ML IJ SOLN
0.2500 mg | INTRAMUSCULAR | Status: DC | PRN
  Administered 2017-07-16: 0.25 mg via INTRAVENOUS
  Administered 2017-07-16: 0.5 mg via INTRAVENOUS
  Filled 2017-07-16: qty 0.5

## 2017-07-16 MED ORDER — MEPERIDINE HCL 25 MG/ML IJ SOLN
6.2500 mg | INTRAMUSCULAR | Status: DC | PRN
  Filled 2017-07-16: qty 1

## 2017-07-16 MED ORDER — SULFAMETHOXAZOLE-TRIMETHOPRIM 800-160 MG PO TABS
1.0000 | ORAL_TABLET | Freq: Two times a day (BID) | ORAL | 0 refills | Status: DC
Start: 2017-07-16 — End: 2021-05-06

## 2017-07-16 MED ORDER — LACTATED RINGERS IV SOLN
INTRAVENOUS | Status: DC | PRN
  Administered 2017-07-16: 13:00:00 via INTRAVENOUS

## 2017-07-16 MED ORDER — ONDANSETRON HCL 4 MG/2ML IJ SOLN
INTRAMUSCULAR | Status: DC | PRN
  Administered 2017-07-16: 4 mg via INTRAVENOUS

## 2017-07-16 MED ORDER — EPHEDRINE SULFATE-NACL 50-0.9 MG/10ML-% IV SOSY
PREFILLED_SYRINGE | INTRAVENOUS | Status: DC | PRN
  Administered 2017-07-16: 10 mg via INTRAVENOUS

## 2017-07-16 MED ORDER — SODIUM CHLORIDE 0.9 % IV SOLN
INTRAVENOUS | Status: DC
  Administered 2017-07-16: 11:00:00 via INTRAVENOUS
  Filled 2017-07-16: qty 1000

## 2017-07-16 MED ORDER — MIDAZOLAM HCL 2 MG/2ML IJ SOLN
INTRAMUSCULAR | Status: AC
  Filled 2017-07-16: qty 2

## 2017-07-16 MED ORDER — ONDANSETRON HCL 4 MG/2ML IJ SOLN
INTRAMUSCULAR | Status: AC
  Filled 2017-07-16: qty 2

## 2017-07-16 MED ORDER — FENTANYL CITRATE (PF) 100 MCG/2ML IJ SOLN
INTRAMUSCULAR | Status: DC | PRN
  Administered 2017-07-16: 25 ug via INTRAVENOUS
  Administered 2017-07-16: 50 ug via INTRAVENOUS
  Administered 2017-07-16: 25 ug via INTRAVENOUS

## 2017-07-16 MED ORDER — LACTATED RINGERS IV SOLN
INTRAVENOUS | Status: DC
  Filled 2017-07-16: qty 1000

## 2017-07-16 MED ORDER — LIDOCAINE 2% (20 MG/ML) 5 ML SYRINGE
INTRAMUSCULAR | Status: AC
  Filled 2017-07-16: qty 5

## 2017-07-16 MED ORDER — DEXAMETHASONE SODIUM PHOSPHATE 4 MG/ML IJ SOLN
INTRAMUSCULAR | Status: DC | PRN
  Administered 2017-07-16: 10 mg via INTRAVENOUS

## 2017-07-16 MED ORDER — LIDOCAINE 2% (20 MG/ML) 5 ML SYRINGE
INTRAMUSCULAR | Status: DC | PRN
  Administered 2017-07-16: 60 mg via INTRAVENOUS

## 2017-07-16 MED ORDER — EPHEDRINE 5 MG/ML INJ
INTRAVENOUS | Status: AC
  Filled 2017-07-16: qty 10

## 2017-07-16 MED ORDER — OXYCODONE HCL 5 MG/5ML PO SOLN
5.0000 mg | Freq: Once | ORAL | Status: DC | PRN
  Filled 2017-07-16: qty 5

## 2017-07-16 MED ORDER — PROMETHAZINE HCL 25 MG/ML IJ SOLN
6.2500 mg | INTRAMUSCULAR | Status: DC | PRN
  Filled 2017-07-16: qty 1

## 2017-07-16 MED ORDER — DEXTROSE 5 % IV SOLN
2.0000 g | INTRAVENOUS | Status: AC
  Administered 2017-07-16: 2 g via INTRAVENOUS
  Filled 2017-07-16: qty 2

## 2017-07-16 MED ORDER — OXYCODONE-ACETAMINOPHEN 5-325 MG PO TABS: 1.0000 | ORAL_TABLET | Freq: Four times a day (QID) | ORAL | 0 refills | Status: AC | PRN

## 2017-07-16 MED ORDER — CEFTRIAXONE SODIUM 2 G IJ SOLR
INTRAMUSCULAR | Status: AC
  Filled 2017-07-16: qty 2

## 2017-07-16 MED ORDER — HYDROMORPHONE HCL-NACL 0.5-0.9 MG/ML-% IV SOSY
PREFILLED_SYRINGE | INTRAVENOUS | Status: AC
  Filled 2017-07-16: qty 1

## 2017-07-16 MED ORDER — DEXAMETHASONE SODIUM PHOSPHATE 10 MG/ML IJ SOLN
INTRAMUSCULAR | Status: AC
  Filled 2017-07-16: qty 1

## 2017-07-16 MED ORDER — PROPOFOL 10 MG/ML IV BOLUS
INTRAVENOUS | Status: DC | PRN
  Administered 2017-07-16: 200 mg via INTRAVENOUS

## 2017-07-16 MED ORDER — FENTANYL CITRATE (PF) 100 MCG/2ML IJ SOLN
INTRAMUSCULAR | Status: AC
  Filled 2017-07-16: qty 2

## 2017-07-16 MED ORDER — SODIUM CHLORIDE 0.9 % IR SOLN
Status: DC | PRN
  Administered 2017-07-16: 4000 mL

## 2017-07-16 MED ORDER — PROPOFOL 10 MG/ML IV BOLUS
INTRAVENOUS | Status: AC
  Filled 2017-07-16: qty 20

## 2017-07-16 MED ORDER — DEXTROSE 5 % IV SOLN
INTRAVENOUS | Status: AC
  Filled 2017-07-16: qty 50

## 2017-07-16 MED ORDER — MIDAZOLAM HCL 5 MG/5ML IJ SOLN
INTRAMUSCULAR | Status: DC | PRN
  Administered 2017-07-16: 2 mg via INTRAVENOUS

## 2017-07-16 MED ORDER — OXYCODONE HCL 5 MG PO TABS
5.0000 mg | ORAL_TABLET | Freq: Once | ORAL | Status: DC | PRN
  Filled 2017-07-16: qty 1

## 2017-07-16 SURGICAL SUPPLY — 28 items
BAG DRAIN URO-CYSTO SKYTR STRL (DRAIN) ×3 IMPLANT
BASKET DAKOTA 1.9FR 11X120 (BASKET) IMPLANT
BASKET STONE 1.7 NGAGE (UROLOGICAL SUPPLIES) ×3 IMPLANT
CATH INTERMIT  6FR 70CM (CATHETERS) ×3 IMPLANT
CLOTH BEACON ORANGE TIMEOUT ST (SAFETY) ×3 IMPLANT
FIBER LASER FLEXIVA 365 (UROLOGICAL SUPPLIES) IMPLANT
FIBER LASER TRAC TIP (UROLOGICAL SUPPLIES) IMPLANT
GLOVE BIO SURGEON STRL SZ8 (GLOVE) ×3 IMPLANT
GLOVE BIOGEL PI IND STRL 7.0 (GLOVE) ×1 IMPLANT
GLOVE BIOGEL PI INDICATOR 7.0 (GLOVE) ×2
GLOVE ECLIPSE 7.0 STRL STRAW (GLOVE) ×3 IMPLANT
GOWN STRL REUS W/TWL LRG LVL3 (GOWN DISPOSABLE) IMPLANT
GOWN STRL REUS W/TWL XL LVL3 (GOWN DISPOSABLE) ×3 IMPLANT
GUIDEWIRE ANG ZIPWIRE 038X150 (WIRE) ×3 IMPLANT
GUIDEWIRE STR DUAL SENSOR (WIRE) ×3 IMPLANT
INFUSOR MANOMETER BAG 3000ML (MISCELLANEOUS) ×3 IMPLANT
IV NS 1000ML (IV SOLUTION) ×2
IV NS 1000ML BAXH (IV SOLUTION) ×1 IMPLANT
IV NS IRRIG 3000ML ARTHROMATIC (IV SOLUTION) ×3 IMPLANT
KIT RM TURNOVER CYSTO AR (KITS) ×3 IMPLANT
MANIFOLD NEPTUNE II (INSTRUMENTS) ×3 IMPLANT
PACK CYSTO (CUSTOM PROCEDURE TRAY) ×3 IMPLANT
SHEATH ACCESS URETERAL 38CM (SHEATH) ×3 IMPLANT
STENT URET 6FRX26 CONTOUR (STENTS) ×3 IMPLANT
SYRINGE 10CC LL (SYRINGE) ×3 IMPLANT
TUBE CONNECTING 12'X1/4 (SUCTIONS) ×1
TUBE CONNECTING 12X1/4 (SUCTIONS) ×2 IMPLANT
TUBE FEEDING 8FR 16IN STR KANG (MISCELLANEOUS) IMPLANT

## 2017-07-16 NOTE — Anesthesia Postprocedure Evaluation (Signed)
Anesthesia Post Note  Patient: Isaac Summers  Procedure(s) Performed: Procedure(s) (LRB): CYSTOSCOPY/URETEROSCOPY/HOLMIUM LASER/STENT EXCHANGE (Left) CYSTOSCOPY WITH RETROGRADE PYELOGRAM (Left)     Patient location during evaluation: PACU Anesthesia Type: General Level of consciousness: awake and alert Pain management: pain level controlled Vital Signs Assessment: post-procedure vital signs reviewed and stable Respiratory status: spontaneous breathing, nonlabored ventilation, respiratory function stable and patient connected to nasal cannula oxygen Cardiovascular status: blood pressure returned to baseline and stable Postop Assessment: no signs of nausea or vomiting Anesthetic complications: no    Last Vitals:  Vitals:   07/16/17 1330 07/16/17 1345  BP: 125/78 137/83  Pulse: 81 83  Resp: 17 14  Temp:    SpO2: 91% 91%    Last Pain:  Vitals:   07/16/17 1420  TempSrc:   PainSc: 3                  Effie Berkshire

## 2017-07-16 NOTE — Op Note (Signed)
.  Preoperative diagnosis: Left renalstone  Postoperative diagnosis: Same  Procedure: 1 cystoscopy 2. Left retrograde pyelography 3.  Intraoperative fluoroscopy, under one hour, with interpretation 4.  Left ureteroscopic stone manipulation with laser lithotripsy 5.  Left 6 x 26 JJ stent exchange  Attending: Rosie Fate  Anesthesia: General  Estimated blood loss: None  Drains: Left 6 x 26 JJ ureteral stent with tether  Specimens: stone for analysis  Antibiotics: rocephin  Findings: left lower, mid pole stones. No hydronephrosis. No masses/lesions in the bladder. Ureteral orifices in normal anatomic location.  Indications: Patient is a 46 year old male with a history of left ureteral stone and sepsis who underwent left stent placement 2 weeks ago. He is here for definitive therapy.  After discussing treatment options, she decided proceed with left ureteroscopic stone manipulation.  Procedure her in detail: The patient was brought to the operating room and a brief timeout was done to ensure correct patient, correct procedure, correct site.  General anesthesia was administered patient was placed in dorsal lithotomy position.  Her genitalia was then prepped and draped in usual sterile fashion.  A rigid 21 French cystoscope was passed in the urethra and the bladder.  Bladder was inspected free masses or lesions.  the ureteral orifices were in the normal orthotopic locations.  Using a grasper the left ureteral stent was brought to the urethral meatus. A zipwire was advanced through the stent and up to the renal pelvis. The stent was then removed. a 6 french ureteral catheter was then instilled into the left ureteral orifice.  a gentle retrograde was obtained and findings noted above.   we then removed the cystoscope and cannulated the left ureteral orifice with a semirigid ureteroscope.  No stone was found in the ureter. Once we reached the UPJ a sensor wire was advanced in to the renal  pelvis. We then removed the ureteroscope and advanced am 12/14 x 38cm access sheath up to the renal pelvis. We then used the flexible ureteroscope to perform nephroscopy. We encountered the stone in the lower and mid pole.  Using a 200nm laser fiber the lower pole calculus was fragmented.  the pieces were then removed with a Ngage basket.    once all stone fragments were removed we then removed the access sheath under direct vision and noted no injury to the ureter. We then placed a 6 x 26 double-j ureteral stent over the original zip wire.  We then removed the wire and good coil was noted in the the renal pelvis under fluoroscopy and the bladder under direct vision. the bladder was then drained and this concluded the procedure which was well tolerated by patient.  Complications: None  Condition: Stable, extubated, transferred to PACU  Plan: Patient is to be discharged home as to follow-up in 2 weeks. He is to remove his stent by pulling the tether in 72 hours

## 2017-07-16 NOTE — Transfer of Care (Signed)
Immediate Anesthesia Transfer of Care Note  Patient: Isaac Summers  Procedure(s) Performed: Procedure(s) (LRB): CYSTOSCOPY/URETEROSCOPY/HOLMIUM LASER/STENT EXCHANGE (Left) CYSTOSCOPY WITH RETROGRADE PYELOGRAM (Left)  Patient Location: PACU  Anesthesia Type: General  Level of Consciousness: awake, oriented, sedated and patient cooperative  Airway & Oxygen Therapy: Patient Spontanous Breathing and Patient connected to face mask oxygen  Post-op Assessment: Report given to PACU RN and Post -op Vital signs reviewed and stable  Post vital signs: Reviewed and stable  Complications: No apparent anesthesia complications  Last Vitals:  Vitals:   07/16/17 1027 07/16/17 1249  BP: 109/75 140/76  Pulse: 73 88  Resp: 16 15  Temp: 37.4 C 36.7 C  SpO2: 97% 96%    Last Pain:  Vitals:   07/16/17 1044  TempSrc:   PainSc: 3       Patients Stated Pain Goal: 5 (07/16/17 1044)

## 2017-07-16 NOTE — Interval H&P Note (Signed)
History and Physical Interval Note:  07/16/2017 11:52 AM  Isaac Summers  has presented today for surgery, with the diagnosis of LEFT URETERAL STONE  The various methods of treatment have been discussed with the patient and family. After consideration of risks, benefits and other options for treatment, the patient has consented to  Procedure(s) with comments: CYSTOSCOPY/URETEROSCOPY/HOLMIUM LASER/STENT EXCHANGE (Left) - NEEDS TOTAL 30 MIN CYSTOSCOPY WITH RETROGRADE PYELOGRAM (Left) as a surgical intervention .  The patient's history has been reviewed, patient examined, no change in status, stable for surgery.  I have reviewed the patient's chart and labs.  Questions were answered to the patient's satisfaction.     Nicolette Bang

## 2017-07-16 NOTE — H&P (View-Only) (Signed)
Urology Admission H&P  Chief Complaint: left flank pain, fever  History of Present Illness: [name] is a 46yo with a hx of nephrolithiasis who presented to my office today with a 1 week history of severe, sharp, constant, nonradiating left flank pain. He developed a fever this morning and is currently 101.38F. He has associated nausea and chills. CT scan shows a 34mm left proximal ureteral calculus with moderate hydronephrosis and perinephric stranding  Past Medical History:  Diagnosis Date  . GERD (gastroesophageal reflux disease)   . Gout   . Hyperlipemia   . Hypertension   . Melanoma ([place])    excision  left shoulder    Past Surgical History:  Procedure Laterality Date  . LEFT SHOULDER RECONSTRUCTION     1992  . VASECTOMY     2006  . WIDE EXCISION FROM LEFT SHOULDER .     2007  & 2008    Home Medications:  Prescriptions Prior to Admission  Medication Sig Dispense Refill Last Dose  . acetaminophen (TYLENOL) 500 MG tablet Take 1,000 mg by mouth every 6 (six) hours as needed for mild pain or moderate pain.   06/18/2017 at Unknown time  . aspirin EC 81 MG tablet Take 81 mg by mouth daily.   06/19/2017 at Unknown time  . atorvastatin (LIPITOR) 20 MG tablet Take 10 mg by mouth daily.   06/19/2017 at Unknown time  . celecoxib (CELEBREX) 200 MG capsule Take 200 mg by mouth daily as needed for pain.   couple of months at Unknown time  . colchicine 0.6 MG tablet Take 0.6 mg by mouth 2 (two) times daily as needed (gout).    couple of months at Unknown time  . EPINEPHrine 0.3 mg/0.3 mL IJ SOAJ injection Inject 0.3 mg into the muscle as needed for anaphylaxis.   couple of months  . fluticasone (FLONASE) 50 MCG/ACT nasal spray Place 2 sprays into both nostrils 2 (two) times daily as needed for allergies or rhinitis.   2 months ago  . ibuprofen (ADVIL,MOTRIN) 200 MG tablet Take 400 mg by mouth every 6 (six) hours as needed for mild pain or moderate pain.   06/18/2017 at Unknown time  .  olmesartan (BENICAR) 40 MG tablet Take 40 mg by mouth daily.  4 06/19/2017 at Unknown time  . PROAIR HFA 108 (90 Base) MCG/ACT inhaler Take 2 puffs by mouth every 6 (six) hours as needed for wheezing or shortness of breath.   couple of months  . sertraline (ZOLOFT) 50 MG tablet Take 50 mg by mouth daily.   06/19/2017 at Unknown time  . tamsulosin (FLOMAX) 0.4 MG CAPS capsule Take 0.4 mg by mouth daily.   06/19/2017 at Unknown time  . ULORIC 80 MG TABS Take 40 mg by mouth daily.   06/19/2017 at Unknown time   Allergies: No Known Allergies  Family History  Problem Relation Age of Onset  . CVA Mother   . Hypertension Mother   . CAD Father        CAD with multiple stents and recents  cardiac arrest. HTN   . Hypertension Brother    Social History:  reports that he has never smoked. He has never used smokeless tobacco. He reports that he drinks alcohol. He reports that he does not use drugs.  Review of Systems  Constitutional: Positive for chills, fever and malaise/fatigue.  Gastrointestinal: Positive for nausea and vomiting.  Genitourinary: Positive for flank pain.  All other systems reviewed and are negative.  Physical Exam:  Vital signs in last 24 hours: Temp:  [101.2 F (38.4 C)] 101.2 F (38.4 C) (08/10 1650) Pulse Rate:  [103] 103 (08/10 1650) Resp:  [20] 20 (08/10 1650) BP: (116)/(70) 116/70 (08/10 1650) SpO2:  [95 %] 95 % (08/10 1650) Weight:  [97.4 kg (214 lb 12.8 oz)] 97.4 kg (214 lb 12.8 oz) (08/10 1650) Physical Exam  Constitutional: He is oriented to person, place, and time. He appears well-developed and well-nourished.  HENT:  Head: Normocephalic and atraumatic.  Eyes: Pupils are equal, round, and reactive to light. EOM are normal.  Neck: Normal range of motion. No thyromegaly present.  Cardiovascular: Normal rate and regular rhythm.   Respiratory: Effort normal. No respiratory distress.  GI: Soft. He exhibits no distension.  Musculoskeletal: Normal range of motion.  He exhibits no edema.  Neurological: He is alert and oriented to person, place, and time.  Skin: Skin is warm and dry.  Psychiatric: He has a normal mood and affect. His behavior is normal. Judgment and thought content normal.    Laboratory Data:  No results found for this or any previous visit (from the past 24 hour(s)). No results found for this or any previous visit (from the past 240 hour(s)). Creatinine: No results for input(s): CREATININE in the last 168 hours. Baseline Creatinine: unknwon  Impression/Assessment:  45yo with left ureteral calculus, sepsis from a urinary source  Plan:  1. The risks/benefits/alternatives to left ureteral stent placement was explained to the patient and he understands and wishes to proceed with surgery 2. Pt will be admitted for IV antibiotics pending urine culture  Nicolette Bang 06/19/2017, 5:44 PM

## 2017-07-16 NOTE — Anesthesia Procedure Notes (Signed)
Procedure Name: LMA Insertion Date/Time: 07/16/2017 11:58 AM Performed by: Denna Haggard D Pre-anesthesia Checklist: Patient identified, Emergency Drugs available, Suction available and Patient being monitored Patient Re-evaluated:Patient Re-evaluated prior to induction Oxygen Delivery Method: Circle system utilized Preoxygenation: Pre-oxygenation with 100% oxygen Induction Type: IV induction Ventilation: Mask ventilation without difficulty LMA: LMA inserted LMA Size: 4.0 Number of attempts: 1 Airway Equipment and Method: Bite block Placement Confirmation: positive ETCO2 Tube secured with: Tape Dental Injury: Teeth and Oropharynx as per pre-operative assessment

## 2017-07-16 NOTE — Anesthesia Preprocedure Evaluation (Addendum)
Anesthesia Evaluation  Patient identified by MRN, date of birth, ID band Patient awake    Reviewed: Allergy & Precautions, NPO status , Patient's Chart, lab work & pertinent test results  Airway Mallampati: III  TM Distance: >3 FB Neck ROM: Full    Dental  (+) Teeth Intact, Dental Advisory Given   Pulmonary asthma ,    breath sounds clear to auscultation       Cardiovascular hypertension, Pt. on medications negative cardio ROS   Rhythm:Regular Rate:Normal     Neuro/Psych negative neurological ROS     GI/Hepatic Neg liver ROS, GERD  Medicated,  Endo/Other  negative endocrine ROS  Renal/GU negative Renal ROS     Musculoskeletal  (+) Arthritis ,   Abdominal Normal abdominal exam  (+)   Peds  Hematology negative hematology ROS (+)   Anesthesia Other Findings - HLD  Reproductive/Obstetrics negative OB ROS                            Anesthesia Physical Anesthesia Plan  ASA: II  Anesthesia Plan: General   Post-op Pain Management:    Induction: Intravenous  PONV Risk Score and Plan: 3 and Ondansetron, Dexamethasone, Midazolam and Propofol infusion  Airway Management Planned: LMA  Additional Equipment:   Intra-op Plan:   Post-operative Plan: Extubation in OR  Informed Consent: I have reviewed the patients History and Physical, chart, labs and discussed the procedure including the risks, benefits and alternatives for the proposed anesthesia with the patient or authorized representative who has indicated his/her understanding and acceptance.   Dental advisory given  Plan Discussed with: CRNA  Anesthesia Plan Comments:         Anesthesia Quick Evaluation

## 2017-07-16 NOTE — Discharge Instructions (Signed)
Ureteral Stent Implantation, Care After Refer to this sheet in the next few weeks. These instructions provide you with information about caring for yourself after your procedure. Your health care provider may also give you more specific instructions. Your treatment has been planned according to current medical practices, but problems sometimes occur. Call your health care provider if you have any problems or questions after your procedure. What can I expect after the procedure? After the procedure, it is common to have:  Nausea.  Mild pain when you urinate. You may feel this pain in your lower back or lower abdomen. Pain should stop within a few minutes after you urinate. This may last for up to 1 week.  A small amount of blood in your urine for several days.  Follow these instructions at home:  Medicines  Take over-the-counter and prescription medicines only as told by your health care provider.  If you were prescribed an antibiotic medicine, take it as told by your health care provider. Do not stop taking the antibiotic even if you start to feel better.  Do not drive for 24 hours if you received a sedative.  Do not drive or operate heavy machinery while taking prescription pain medicines. Activity  Return to your normal activities as told by your health care provider. Ask your health care provider what activities are safe for you.  Do not lift anything that is heavier than 10 lb (4.5 kg). Follow this limit for 1 week after your procedure, or for as long as told by your health care provider. General instructions  Watch for any blood in your urine. Call your health care provider if the amount of blood in your urine increases.  If you have a catheter: ? Follow instructions from your health care provider about taking care of your catheter and collection bag. ? Do not take baths, swim, or use a hot tub until your health care provider approves.  Drink enough fluid to keep your urine  clear or pale yellow.  Keep all follow-up visits as told by your health care provider. This is important. Contact a health care provider if:  You have pain that gets worse or does not get better with medicine, especially pain when you urinate.  You have difficulty urinating.  You feel nauseous or you vomit repeatedly during a period of more than 2 days after the procedure. Get help right away if:  Your urine is dark red or has blood clots in it.  You are leaking urine (have incontinence).  The end of the stent comes out of your urethra.  You cannot urinate.  You have sudden, sharp, or severe pain in your abdomen or lower back.  You have a fever. This information is not intended to replace advice given to you by your health care provider. Make sure you discuss any questions you have with your health care provider. Document Released: 06/29/2013 Document Revised: 04/03/2016 Document Reviewed: 05/11/2015 Elsevier Interactive Patient Education  2018 [facility] [phone] Post Ureteroscopy With or Without Stent Instructions  Definitions:  Ureter: The duct that transports urine from the kidney to the bladder. Stent:   A plastic hollow tube that is placed into the ureter, from the kidney to the                 bladder to prevent the ureter from swelling shut.  GENERAL INSTRUCTIONS:  Despite the fact that no skin incisions were used, the area around the ureter and  bladder is raw and irritated. The stent is a foreign body which will further irritate the bladder wall. This irritation is manifested by increased frequency of urination, both day and night, and by an increase in the urge to urinate. In some, the urge to urinate is present almost always. Sometimes the urge is strong enough that you may not be able to stop yourself from urinating. The only real cure is to remove the stent and then give time for the bladder wall to heal which can't be done  until the danger of the ureter swelling shut has passed, which varies.  You may see some blood in your urine while the stent is in place and a few days afterwards. Do not be alarmed, even if the urine was clear for a while. Get off your feet and drink lots of fluids until clearing occurs. If you start to pass clots or don't improve, call us.  DIET: You may return to your normal diet immediately. Because of the raw surface of your bladder, alcohol, spicy foods, acid type foods and drinks with caffeine may cause irritation or frequency and should be used in moderation. To keep your urine flowing freely and to avoid constipation, drink plenty of fluids during the day ( 8-10 glasses ). Tip: Avoid cranberry juice because it is very acidic.  ACTIVITY: Your physical activity doesn't need to be restricted. However, if you are very active, you may see some blood in your urine. We suggest that you reduce your activity under these circumstances until the bleeding has stopped.  BOWELS: It is important to keep your bowels regular during the postoperative period. Straining with bowel movements can cause bleeding. A bowel movement every other day is reasonable. Use a mild laxative if needed, such as Milk of Magnesia 2-3 tablespoons, or 2 Dulcolax tablets. Call if you continue to have problems. If you have been taking narcotics for pain, before, during or after your surgery, you may be constipated. Take a laxative if necessary.   MEDICATION: You should resume your pre-surgery medications unless told not to. In addition you will often be given an antibiotic to prevent infection. These should be taken as prescribed until the bottles are finished unless you are having an unusual reaction to one of the drugs.  PROBLEMS YOU SHOULD REPORT TO [place]:  Fevers over 100.5 Fahrenheit.  Heavy bleeding, or clots ( See above notes about blood in urine ).  Inability to urinate.  Drug reactions ( hives, rash, nausea,  vomiting, diarrhea ).  Severe burning or pain with urination that is not improving.  FOLLOW-UP: You will need a follow-up appointment to monitor your progress. Call for this appointment at the number listed above. Usually the first appointment will be about three to fourteen days after your surgery.      Post Anesthesia Home Care Instructions  Activity: Get plenty of rest for the remainder of the day. A responsible individual must stay with you for 24 hours following the procedure.  For the next 24 hours, DO NOT: -Drive a car -Paediatric nurse -Drink alcoholic beverages -Take any medication unless instructed by your physician -Make any legal decisions or sign important papers.  Meals: Start with liquid foods such as gelatin or soup. Progress to regular foods as tolerated. Avoid greasy, spicy, heavy foods. If nausea and/or vomiting occur, drink only clear liquids until the nausea and/or vomiting subsides. Call your physician if vomiting continues.  Special Instructions/Symptoms: Your throat may feel dry or sore from the  anesthesia or the breathing tube placed in your throat during surgery. If this causes discomfort, gargle with warm salt water. The discomfort should disappear within 24 hours.  If you had a scopolamine patch placed behind your ear for the management of post- operative nausea and/or vomiting:  1. The medication in the patch is effective for 72 hours, after which it should be removed.  Wrap patch in a tissue and discard in the trash. Wash hands thoroughly with soap and water. 2. You may remove the patch earlier than 72 hours if you experience unpleasant side effects which may include dry mouth, dizziness or visual disturbances. 3. Avoid touching the patch. Wash your hands with soap and water after contact with the patch.     REMOVE YOUR STENT IN 72 HOURS BY GENTLY PULLING THE STRING

## 2017-07-17 ENCOUNTER — Telehealth: Payer: Self-pay

## 2017-07-17 ENCOUNTER — Encounter (HOSPITAL_BASED_OUTPATIENT_CLINIC_OR_DEPARTMENT_OTHER): Payer: Self-pay | Admitting: Urology

## 2017-07-17 NOTE — Telephone Encounter (Signed)
-----   Message from Barkley Boards, RN sent at 07/17/2017 11:22 AM EDT ----- Regarding: FW: referral  Hey,   Can you guys follow-up about this referral to Dr Burt Knack or Dr Angelena Form for Infarct seen on EKG.  This is a new pt consult and they are specifically requesting one of these docs.  Thank you, Lauren ----- Message ----- From: Howie Ill Sent: 07/17/2017   9:37 AM To: Barkley Boards, RN, Thompson Grayer, RN Subject: referral                                       Please call Raliegh Ip -Judeen Hammans [phone]   Raliegh Ip would like pt to see only Dr Burt Knack or Dr Angelena Form for new patient.   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

## 2017-07-17 NOTE — Telephone Encounter (Signed)
Spoke with patient's wife and scheduled appointment 9/19 at noon. She reports he had a work-up several years (more than a decade) by Dr. Tamala Julian - records requested from [place]. She was grateful for call and agrees with treatment plan.

## 2017-07-29 ENCOUNTER — Telehealth (HOSPITAL_COMMUNITY): Payer: Self-pay | Admitting: *Deleted

## 2017-07-29 ENCOUNTER — Encounter (INDEPENDENT_AMBULATORY_CARE_PROVIDER_SITE_OTHER): Payer: Self-pay

## 2017-07-29 ENCOUNTER — Ambulatory Visit (INDEPENDENT_AMBULATORY_CARE_PROVIDER_SITE_OTHER): Payer: Managed Care, Other (non HMO) | Admitting: Cardiovascular Disease

## 2017-07-29 ENCOUNTER — Encounter: Payer: Self-pay | Admitting: Cardiovascular Disease

## 2017-07-29 VITALS — BP 110/78 | HR 88 | Ht 68.0 in | Wt 217.8 lb

## 2017-07-29 DIAGNOSIS — E785 Hyperlipidemia, unspecified: Secondary | ICD-10-CM | POA: Diagnosis not present

## 2017-07-29 DIAGNOSIS — R0609 Other forms of dyspnea: Secondary | ICD-10-CM | POA: Diagnosis not present

## 2017-07-29 DIAGNOSIS — R079 Chest pain, unspecified: Secondary | ICD-10-CM

## 2017-07-29 DIAGNOSIS — R9431 Abnormal electrocardiogram [ECG] [EKG]: Secondary | ICD-10-CM

## 2017-07-29 MED ORDER — ATORVASTATIN CALCIUM 20 MG PO TABS: 20.0000 mg | ORAL_TABLET | Freq: Every day | ORAL | 3 refills | Status: DC

## 2017-07-29 NOTE — Telephone Encounter (Signed)
Patient given detailed instructions per Myocardial Perfusion Study Information Sheet for the test on 07/31/17 at 8:00. Patient notified to arrive 15 minutes early and that it is imperative to arrive on time for appointment to keep from having the test rescheduled.  If you need to cancel or reschedule your appointment, please call the office within 24 hours of your appointment. . Patient verbalized understanding.Isaac Summers

## 2017-07-29 NOTE — Patient Instructions (Signed)
Medication Instructions:  1) INCREASE LIPITOR (atorvastatin) to 20 mg daily  Labwork: Please arrange fasting blood work with your PCP in about 3 months.  Testing/Procedures: Dr. Burt Knack recommends you have a NUCLEAR STRESS TEST.  Follow-Up: Your provider recommends that you schedule a follow-up appointment AS NEEDED with Dr. Burt Knack pending study results.  Any Other Special Instructions Will Be Listed Below (If Applicable).     If you need a refill on your cardiac medications before your next appointment, please call your pharmacy.

## 2017-07-29 NOTE — Progress Notes (Signed)
Cardiology Office Note Date:  07/30/2017   ID:  Isaac Summers, DOB [birth year], MRN [id number]  PCP:  Chesley Noon, MD  Cardiologist:  Sherren Mocha, MD    Chief Complaint  Patient presents with  . Shortness of Breath     History of Present Illness: Isaac Summers is a 46 y.o. male who presents for evaluation of shortness of breath and an abnormal EKG.  The patient is here with his wife today. He recently underwent cystoscopy and lithotripsy when he presented with a large kidney stone. 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 He presents today for further evaluation.  The patient is physically active. He typically does not have any exertional symptoms, but over the last few months he has been experiencing dyspnea with exertion. He has notices with walking up 1-2 flights of stairs. He's had no exertional chest pain or pressure. He denies heart palpitations, orthopnea, PND, lightheadedness, or syncope. He denies cough or wheezing. Shortness of breath resolves with rest. He has no personal history of cardiac disease.  The patient does have hyperlipidemia and hypertension, both of which are treated over several years. He has a very strong family history of coronary artery disease as outlined below.   Past Medical History:  Diagnosis Date  . Allergic rhinitis   . Arthritis   . GERD (gastroesophageal reflux disease)   . Gout    per pt as of 07-10-2017-- stable  . History of kidney stones   . History of malignant melanoma of skin 2007   2007  left chest wall s/p  [facility]/  2008 left shoulder area  . Hyperlipidemia   . Hypertension   . Left ureteral stone   . Mild intermittent asthma   . Urgency of urination   . Wears glasses     Past Surgical History:  Procedure Laterality Date  . CYSTOSCOPY W/ RETROGRADES Left 07/16/2017   Procedure: CYSTOSCOPY WITH RETROGRADE PYELOGRAM;  Surgeon:  Cleon Gustin, MD;  Location: [facility];  Service: Urology;  Laterality: Left;  . CYSTOSCOPY W/ URETERAL STENT PLACEMENT Left 06/19/2017   Procedure: CYSTOSCOPY WITH RETROGRADE PYELOGRAM/URETERAL STENT PLACEMENT;  Surgeon: Cleon Gustin, MD;  Location: [facility];  Service: Urology;  Laterality: Left;  . CYSTOSCOPY/URETEROSCOPY/HOLMIUM LASER/STENT PLACEMENT Left 07/16/2017   Procedure: CYSTOSCOPY/URETEROSCOPY/HOLMIUM LASER/STENT EXCHANGE;  Surgeon: Cleon Gustin, MD;  Location: [facility];  Service: Urology;  Laterality: Left;  NEEDS TOTAL 30 MIN  . KNEE ARTHROSCOPY Right 2013 approx.  Marland Kitchen LEFT SHOULDER RECONSTRUCTION  1992  . WIDE LOCAL EXCISION LEFT CHEST WALL  06/26/2006   malignant melanoma    Current Outpatient Prescriptions  Medication Sig Dispense Refill  . acetaminophen (TYLENOL) 500 MG tablet Take 1,000 mg by mouth every 6 (six) hours as needed for mild pain or moderate pain.    Marland Kitchen atorvastatin (LIPITOR) 20 MG tablet Take 1 tablet (20 mg total) by mouth daily. 90 tablet 3  . celecoxib (CELEBREX) 200 MG capsule Take 200 mg by mouth daily as needed for pain.    Marland Kitchen colchicine 0.6 MG tablet Take 0.6 mg by mouth 2 (two) times daily as needed (gout).     . Dexlansoprazole 30 MG capsule Take 30 mg by mouth daily.    Marland Kitchen EPINEPHrine 0.3 mg/0.3 mL IJ SOAJ injection Inject 0.3 mg into the muscle as needed for anaphylaxis.    Marland Kitchen esomeprazole (NEXIUM) 20 MG capsule Take 20  mg by mouth as needed.    . fluticasone (FLONASE) 50 MCG/ACT nasal spray Place 2 sprays into both nostrils 2 (two) times daily as needed for allergies or rhinitis.    Marland Kitchen ibuprofen (ADVIL,MOTRIN) 200 MG tablet Take 400 mg by mouth every 6 (six) hours as needed for mild pain or moderate pain.    Marland Kitchen olmesartan (BENICAR) 40 MG tablet Take 40 mg by mouth every morning.   4  . oxyCODONE-acetaminophen (ROXICET) 5-325 MG tablet Take 1 tablet by mouth every 6 (six) hours as needed. 30 tablet 0  .  PROAIR HFA 108 (90 Base) MCG/ACT inhaler Take 2 puffs by mouth every 6 (six) hours as needed for wheezing or shortness of breath.    . sertraline (ZOLOFT) 50 MG tablet Take 50 mg by mouth every morning.     . sulfamethoxazole-trimethoprim (BACTRIM DS,SEPTRA DS) 800-160 MG tablet Take 1 tablet by mouth 2 (two) times daily. 8 tablet 0  . tamsulosin (FLOMAX) 0.4 MG CAPS capsule Take 0.4 mg by mouth daily after breakfast.     . ULORIC 80 MG TABS Take 40 mg by mouth every morning.     Marland Kitchen aspirin EC 81 MG tablet Take 81 mg by mouth daily.     No current facility-administered medications for this visit.     Allergies:   Other   Social History:  The patient  reports that he has never smoked. He has never used smokeless tobacco. He reports that he drinks about 1.8 - 2.4 oz of alcohol per week . He reports that he does not use drugs.   Family History:  The patient's family history includes CAD in his father; CVA in his mother; Hypertension in his brother and mother.   Father - MI in 48's, nonsmoker. Sister - pulmonary embolism  ROS:  Please see the history of present illness.  Otherwise, review of systems is positive for shortness of breath with exertion.  All other systems are reviewed and negative.    PHYSICAL EXAM: VS:  BP 110/78   Pulse 88   Ht 5\' 8"  (1.727 m)   Wt 98.8 kg (217 lb 12.8 oz)   SpO2 95%   BMI 33.12 kg/m  , BMI Body mass index is 33.12 kg/m. GEN: Well nourished, well developed, in no acute distress  HEENT: normal  Neck: no JVD, no masses. No carotid bruits Cardiac: RRR without murmur or gallop                Respiratory:  clear to auscultation bilaterally, normal work of breathing GI: soft, nontender, nondistended, + BS MS: no deformity or atrophy  Ext: no pretibial edema, pedal pulses 2+= bilaterally Skin: warm and dry, no rash Neuro:  Strength and sensation are intact Psych: euthymic mood, full affect  EKG:  EKG is not ordered today. EKG 07/16/2017: NSR with possible  age-indeterminate anterior infarct and possible age-indeterminate inferior infarct  Recent Labs: 06/20/2017: BUN 20; Creatinine, Ser 1.36; Hemoglobin 13.2; Platelets 170; Potassium 4.7; Sodium 139   Lipid Panel  No results found for: CHOL, TRIG, HDL, CHOLHDL, VLDL, LDLCALC, LDLDIRECT    Wt Readings from Last 3 Encounters:  07/29/17 98.8 kg (217 lb 12.8 oz)  07/16/17 97.1 kg (214 lb)  06/19/17 97.4 kg (214 lb 12.8 oz)    ASSESSMENT AND PLAN: 1.  Abnormal EKG 2. Exertional dyspnea 3. Hypertension, well controlled 4. Hyperlipidemia: Treated with atorvastatin 10 mg.  The patient's EKG suggests a possible age-indeterminate anterior and inferior infarct.  An old EKG is reviewed and the anterior changes are not new. However, considering this patient's high risk profile and new onset exertional dyspnea, I have recommended proceeding with an exercise Myoview scan which would demonstrate the presence of an old infarct but also would show any new ischemic changes brought on by exertion. We'll follow-up pending the patient's stress test results. Regarding his hyperlipidemia, his most recent LDL cholesterol was 140 mg/dL. I recommended that he increase his atorvastatin dose to 20 mg in suspect he will need to increase it further to a high intensity range. He will have follow-up lipids and LFTs in 3 months with his primary physician. His hypertension appears to be well controlled at this time. He should remain on an aspirin 81 mg daily. We'll be in touch with him as soon as his nuclear scan results available. Current medicines are reviewed with the patient today.  The patient does not have concerns regarding medicines.  Labs/ tests ordered today include:   Orders Placed This Encounter  Procedures  . Myocardial Perfusion Imaging    Disposition:   FU as needed pending stress test results  Signed, Sherren Mocha, MD  07/30/2017 1:09 PM    [address] [facility],  [place], [place]  [address] Phone: [phone]; Fax: [phone]

## 2017-07-31 ENCOUNTER — Encounter (HOSPITAL_COMMUNITY): Payer: Managed Care, Other (non HMO)

## 2017-08-03 ENCOUNTER — Encounter (HOSPITAL_COMMUNITY): Payer: Managed Care, Other (non HMO)

## 2017-08-03 ENCOUNTER — Telehealth (HOSPITAL_COMMUNITY): Payer: Self-pay | Admitting: *Deleted

## 2017-08-03 NOTE — Telephone Encounter (Signed)
Previous phone note disregard. No DPR scanned in epic and unable to leave note.Kiora Hallberg, [place] Left message on voicemail in reference to upcoming appointment scheduled for 08/06/17. Phone number given for a call back so details instructions can be given. Margret Moat, [place]e [place]

## 2017-08-03 NOTE — Telephone Encounter (Signed)
Left message on voicemail in reference to upcoming appointment scheduled for 08/06/17. Phone number given for a call back so details instructions can be given. Ellamarie Naeve, Ranae [place]

## 2017-08-04 ENCOUNTER — Telehealth (HOSPITAL_COMMUNITY): Payer: Self-pay | Admitting: *Deleted

## 2017-08-04 NOTE — Telephone Encounter (Signed)
Patient given detailed instructions per Myocardial Perfusion Study Information Sheet for the test on 08/06/17 at 0945. Patient notified to arrive 15 minutes early and that it is imperative to arrive on time for appointment to keep from having the test rescheduled.  If you need to cancel or reschedule your appointment, please call the office within 24 hours of your appointment. . Patient verbalized understanding.Jeanpaul Biehl, [place]

## 2017-08-06 ENCOUNTER — Ambulatory Visit (HOSPITAL_COMMUNITY): Payer: Managed Care, Other (non HMO) | Attending: Cardiology

## 2017-08-06 DIAGNOSIS — R079 Chest pain, unspecified: Secondary | ICD-10-CM | POA: Insufficient documentation

## 2017-08-06 LAB — MYOCARDIAL PERFUSION IMAGING
CHL CUP MPHR: 173 {beats}/min
CHL CUP NUCLEAR SRS: 0
CHL CUP NUCLEAR SSS: 2
CSEPEW: 12.6 METS
CSEPPHR: 176 {beats}/min
Exercise duration (min): 10 min
Exercise duration (sec): 30 s
LHR: 0.3
LV dias vol: 91 mL (ref 62–150)
LV sys vol: 34 mL
NUC STRESS TID: 0.71
Percent HR: 101 %
Rest HR: 72 {beats}/min
SDS: 2

## 2017-08-06 MED ORDER — TECHNETIUM TC 99M TETROFOSMIN IV KIT
10.4000 | PACK | Freq: Once | INTRAVENOUS | Status: AC | PRN
  Administered 2017-08-06: 10.4 via INTRAVENOUS
  Filled 2017-08-06: qty 11

## 2017-08-06 MED ORDER — TECHNETIUM TC 99M TETROFOSMIN IV KIT
32.8000 | PACK | Freq: Once | INTRAVENOUS | Status: AC | PRN
  Administered 2017-08-06: 32.8 via INTRAVENOUS
  Filled 2017-08-06: qty 33

## 2017-08-10 ENCOUNTER — Telehealth: Payer: Self-pay | Admitting: Cardiovascular Disease

## 2017-08-10 NOTE — Telephone Encounter (Signed)
New message    Pt is calling to find out about his stress test results. Please call.

## 2017-08-10 NOTE — Telephone Encounter (Signed)
Informed patient of results and verbal understanding expressed.  

## 2017-08-10 NOTE — Telephone Encounter (Signed)
-----   Message from Sherren Mocha, MD sent at 08/07/2017  7:25 PM EDT ----- Normal stress perfusion study. No further cardiac evaluation indicated at this time.

## 2017-10-17 IMAGING — NM NM CHEST EXAM
6 series · 36 of 36 positions shown · non-contrast
Comparison: none

[Series 1: wbr_s-proj_st stress-gsp · 6.40mm/px · 6 of 512 frames shown]
[frame 43/512]
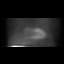
[frame 128/512]
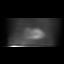
[frame 214/512]
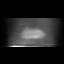
[frame 299/512]
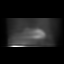
[frame 384/512]
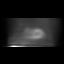
[frame 470/512]
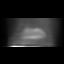

[Series 1: wbr_s-proj_st stress-sum-em · 6.40mm/px · 6 of 64 frames shown]
[frame 6/64]
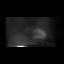
[frame 16/64]
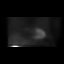
[frame 27/64]
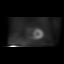
[frame 38/64]
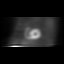
[frame 48/64]
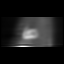
[frame 59/64]
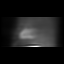

[Series 1: stress-sum-em · 6.40mm/px · 6 of 64 frames shown]
[frame 6/64]
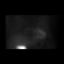
[frame 16/64]
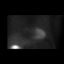
[frame 27/64]
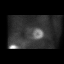
[frame 38/64]
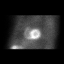
[frame 48/64]
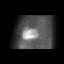
[frame 59/64]
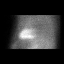

[Series 1: stress-gsp · 6.40mm/px · 6 of 512 frames shown]
[frame 43/512]
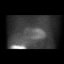
[frame 128/512]
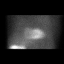
[frame 214/512]
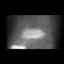
[frame 299/512]
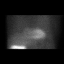
[frame 384/512]
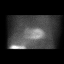
[frame 470/512]
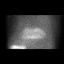

[Series 1: wbr_r-proj_st rest · 6.40mm/px · 6 of 64 frames shown]
[frame 6/64]
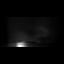
[frame 16/64]
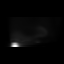
[frame 27/64]
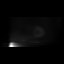
[frame 38/64]
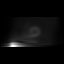
[frame 48/64]
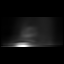
[frame 59/64]
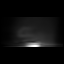

[Series 1: rest · 6.40mm/px · 6 of 64 frames shown]
[frame 6/64]
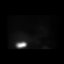
[frame 16/64]
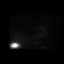
[frame 27/64]
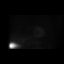
[frame 38/64]
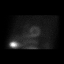
[frame 48/64]
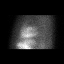
[frame 59/64]
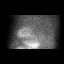

[36 of 36 positions shown; findings below may reference images not displayed]

Canned report from images found in remote index.

Refer to host system for actual result text.

## 2019-05-08 ENCOUNTER — Other Ambulatory Visit: Payer: Self-pay | Admitting: Cardiovascular Disease

## 2019-08-31 ENCOUNTER — Other Ambulatory Visit: Payer: Self-pay

## 2019-08-31 DIAGNOSIS — Z20822 Contact with and (suspected) exposure to covid-19: Secondary | ICD-10-CM

## 2019-09-03 LAB — NOVEL CORONAVIRUS, NAA: SARS-CoV-2, NAA: NOT DETECTED

## 2019-10-25 ENCOUNTER — Other Ambulatory Visit: Payer: Self-pay | Admitting: Orthopedic Surgery

## 2019-10-27 LAB — SARS CORONAVIRUS 2 (TAT 6-24 HRS): SARS Coronavirus 2: NEGATIVE

## 2020-07-18 ENCOUNTER — Other Ambulatory Visit: Payer: Self-pay | Admitting: Orthopedic Surgery

## 2020-07-19 LAB — SARS CORONAVIRUS 2 (TAT 6-24 HRS): SARS Coronavirus 2: NEGATIVE

## 2020-12-28 ENCOUNTER — Telehealth: Payer: Self-pay | Admitting: Cardiovascular Disease

## 2020-12-28 NOTE — Telephone Encounter (Signed)
Patient's wife called and wanted to schedule an appt for patient with Dr. Burt Knack. Patient last seen Dr. Burt Knack in 2018.

## 2021-01-04 NOTE — Telephone Encounter (Signed)
Spoke with the patient and his wife. They understand Dr. Burt Knack has no appointments available and as a new patient (he was last seen September 2018), I am happy to reestablish him with Dr. Gasper Sells for general cardiology needs. The patient's wife, who is a PA, stated she will have no other cardiologist do a heart cath on her husband since everyone has different skill sets. Reiterated to her multiple times that Dr. Gasper Sells would follow the patient for general cardiology needs but would schedule him with Dr. Burt Knack if heart catheterization is ever needed.   While with a pleasant tone, the patient's wife then stated she would call her provider friends and Wannetta Sender and call back with instructions on what I need to do for her.

## 2021-01-20 NOTE — Progress Notes (Signed)
Cardiology Office Note:    Date:  01/22/2021   ID:  Isaac Summers, DOB 29-Sep-1971, MRN [id number]  PCP:  Chesley Noon, MD   [place]  Cardiologist:  No primary care provider on file.  Advanced Practice Provider:  No care team member to display Electrophysiologist:  None   Referring MD: Chesley Noon, MD    History of Present Illness:    Isaac Summers is a 50 y.o. male with a hx of HTN, HLD, GERD and asthma who was referred by Dr. Melford Aase for further evaluation of elevated coronary calcium score.  Patient last seen by Dr. Burt Knack in 2018 for abnormal ECG and SOB. Myoview at that time negative for ischemia.  Saw Dr. Melford Aase 12/10/20 for DOE. Note reviewed. He was recommended for coronary calcium score which demonstrated Ca score 231, LM 0, LAD 97, Cx 20, RCA 114 prompting referral to Cardiology.  The patient states that he really only notices his DOE when he is walking up an incline at his [place]. He is very active and takes a cycling class 2 days a week and hitt classes without issues. Usually works out at least 4-5x/week. No decrease in exercise capacity or chest pain. Has occasional lightheadedness when mowing the lawn at the [place] or when doing burpees in a hitt class. No orthopnea, LE edema, PND. Blood pressures well controlled at home in 120s/70-80s.  Father with MI at 42 and multiple stents, mother with CAD     Past Medical History:  Diagnosis Date  . Allergic rhinitis   . Arthritis   . GERD (gastroesophageal reflux disease)   . Gout    per pt as of 07-10-2017-- stable  . History of kidney stones   . History of malignant melanoma of skin 2007   2007  left chest wall s/p  [facility]/  2008 left shoulder area  . Hyperlipidemia   . Hypertension   . Left ureteral stone   . Mild intermittent asthma   . Urgency of urination   . Wears glasses     Past Surgical History:  Procedure Laterality Date  . CYSTOSCOPY W/ RETROGRADES  Left 07/16/2017   Procedure: CYSTOSCOPY WITH RETROGRADE PYELOGRAM;  Surgeon: Cleon Gustin, MD;  Location: [facility];  Service: Urology;  Laterality: Left;  . CYSTOSCOPY W/ URETERAL STENT PLACEMENT Left 06/19/2017   Procedure: CYSTOSCOPY WITH RETROGRADE PYELOGRAM/URETERAL STENT PLACEMENT;  Surgeon: Cleon Gustin, MD;  Location: [facility];  Service: Urology;  Laterality: Left;  . CYSTOSCOPY/URETEROSCOPY/HOLMIUM LASER/STENT PLACEMENT Left 07/16/2017   Procedure: CYSTOSCOPY/URETEROSCOPY/HOLMIUM LASER/STENT EXCHANGE;  Surgeon: Cleon Gustin, MD;  Location: [facility];  Service: Urology;  Laterality: Left;  NEEDS TOTAL 30 MIN  . KNEE ARTHROSCOPY Right 2013 approx.  Marland Kitchen LEFT SHOULDER RECONSTRUCTION  1992  . WIDE LOCAL EXCISION LEFT CHEST WALL  06/26/2006   malignant melanoma    Current Medications: Current Meds  Medication Sig  . acetaminophen (TYLENOL) 500 MG tablet Take 1,000 mg by mouth every 6 (six) hours as needed for mild pain or moderate pain.  Marland Kitchen aspirin EC 81 MG tablet Take 81 mg by mouth daily.  . celecoxib (CELEBREX) 200 MG capsule Take 200 mg by mouth daily as needed for pain.  Marland Kitchen colchicine 0.6 MG tablet Take 0.6 mg by mouth 2 (two) times daily as needed (gout).   . Dexlansoprazole 30 MG capsule Take 30 mg by mouth daily.  Marland Kitchen EPINEPHrine 0.3 mg/0.3 mL  IJ SOAJ injection Inject 0.3 mg into the muscle as needed for anaphylaxis.  Marland Kitchen esomeprazole (NEXIUM) 20 MG capsule Take 20 mg by mouth as needed.  . febuxostat (ULORIC) 40 MG tablet Take 1 tablet by mouth daily.  . fluticasone (FLONASE) 50 MCG/ACT nasal spray Place 2 sprays into both nostrils 2 (two) times daily as needed for allergies or rhinitis.  Marland Kitchen ibuprofen (ADVIL,MOTRIN) 200 MG tablet Take 400 mg by mouth every 6 (six) hours as needed for mild pain or moderate pain.  Marland Kitchen olmesartan (BENICAR) 40 MG tablet Take 40 mg by mouth every morning.   Marland Kitchen PROAIR HFA 108 (90 Base) MCG/ACT inhaler Take 2 puffs  by mouth every 6 (six) hours as needed for wheezing or shortness of breath.  . rosuvastatin (CRESTOR) 20 MG tablet Take 1 tablet (20 mg total) by mouth daily.  . sertraline (ZOLOFT) 50 MG tablet Take 50 mg by mouth every morning.   . sulfamethoxazole-trimethoprim (BACTRIM DS,SEPTRA DS) 800-160 MG tablet Take 1 tablet by mouth 2 (two) times daily.  . tamsulosin (FLOMAX) 0.4 MG CAPS capsule Take 0.4 mg by mouth daily after breakfast.   . [DISCONTINUED] ULORIC 80 MG TABS Take 40 mg by mouth every morning.      Allergies:   Other   Social History   Socioeconomic History  . Marital status: Married    Spouse name: Not on file  . Number of children: Not on file  . Years of education: Not on file  . Highest education level: Not on file  Occupational History  . Not on file  Tobacco Use  . Smoking status: Never Smoker  . Smokeless tobacco: Never Used  Vaping Use  . Vaping Use: Never used  Substance and Sexual Activity  . Alcohol use: Yes    Alcohol/week: 3.0 - 4.0 standard drinks    Types: 3 - 4 Shots of liquor per week  . Drug use: No  . Sexual activity: Not on file    Comment: 2006--- vasectomy (office)  Other Topics Concern  . Not on file  Social History Narrative  . Not on file   Social Determinants of Health   Financial Resource Strain: Not on file  Food Insecurity: Not on file  Transportation Needs: Not on file  Physical Activity: Not on file  Stress: Not on file  Social Connections: Not on file     Family History: The patient's family history includes CAD in his father; CVA in his mother; Hypertension in his brother and mother.  ROS:   Please see the history of present illness.    Review of Systems  Constitutional: Negative for chills and fever.  HENT: Negative for hearing loss and sore throat.   Eyes: Negative for blurred vision and redness.  Respiratory: Positive for shortness of breath.   Cardiovascular: Negative for chest pain, palpitations, orthopnea,  claudication, leg swelling and PND.  Gastrointestinal: Negative for abdominal pain, melena, nausea and vomiting.  Genitourinary: Negative for dysuria and flank pain.  Musculoskeletal: Negative for falls and myalgias.  Neurological: Positive for dizziness. Negative for loss of consciousness.  Endo/Heme/Allergies: Negative for polydipsia.  Psychiatric/Behavioral: Negative for substance abuse.    EKGs/Labs/Other Studies Reviewed:    The following studies were reviewed today: Coronary Calcium Score 12/17/20: FINDINGS:   Calcium score: 231.  LM: 0.  LAD: 97.  Cx: 20.  RCA: 114   Heart size is Normal  Visible lung fields are: clear  Lymph nodes: Not enlarged  Abdomen: Fatty liver  Myoview 2018:  Nuclear stress EF: 63%.  There was no ST segment deviation noted during stress.  Blood pressure demonstrated a normal response to exercise.  The study is normal.  The left ventricular ejection fraction is normal (55-65%).   1. EF 63% with normal wall motion.  2. Normal perfusion images, no evidence for ischemia/infarction.   Normal study.    EKG:  EKG is  ordered today.  The ekg ordered today demonstrates NSR with HR 71  Recent Labs: No results found for requested labs within last 8760 hours.  Recent Lipid Panel No results found for: CHOL, TRIG, HDL, CHOLHDL, VLDL, LDLCALC, LDLDIRECT     Physical Exam:    VS:  BP 122/84   Pulse 71   Ht 5\' 8"  (1.727 m)   Wt 226 lb (102.5 kg)   SpO2 95%   BMI 34.36 kg/m     Wt Readings from Last 3 Encounters:  01/22/21 226 lb (102.5 kg)  07/29/17 217 lb 12.8 oz (98.8 kg)  07/16/17 214 lb (97.1 kg)     GEN:  Well nourished, well developed in no acute distress HEENT: Normal NECK: No JVD; No carotid bruits CARDIAC: RRR, no murmurs, rubs, gallops RESPIRATORY:  Clear to auscultation without rales, wheezing or rhonchi  ABDOMEN: Soft, non-tender, non-distended MUSCULOSKELETAL:  No edema; No deformity  SKIN: Warm and  dry NEUROLOGIC:  Alert and oriented x 3 PSYCHIATRIC:  Normal affect   ASSESSMENT:    1. Coronary artery disease involving native coronary artery of native heart without angina pectoris   2. Hyperlipidemia, unspecified hyperlipidemia type   3. Medication management   4. Primary hypertension   5. Dyspnea on exertion    PLAN:    In order of problems listed above:  #DOE: #Coronary Calcification: #Family history CAD Patient with episodes of dyspnea only when walking an incline at his [place]. Able to take a cycling class 2 days a week, hitt classes and workout 5 days/weeks without issues. No exertional chest pain or decreased stress test. Underwent exercise myoview in 2018 which was negative for ischemia or infarct. Was recently seen by PCP who recommended calcium score given strong family history of CAD and risk factors Ca score noted to be 231 which prompted referral here. -Change lipitor to crestor 20mg  daily with goal LDL<70 -Continue ASA 81mg  daily -Given that he is very active and able to do high intensity exercise 5x/week, no stress testing indicated at this time; will monitor symptoms and consider imaging if symptoms progress -Discussed healthy diet at length today and provided handout on Mediterranean diet  #HLD: LDL 108. Goal <70. -Change lipitor 20mg  to crestor 20mg  daily -Repeat lipids 6 weeks  #HTN:  Well controlled. -Continue olmesartan 40mg  daily  #Suspected orthostasis: Patient with episodes of lightheadedness after mowing the lawn and rapid change in positions. Admits to poor fluid intake. Suspect orthostasis like symptoms and recommended increasing fluid intake as well as added electrolytes such as power aide zero or gatorade zero with exercise.    Medication Adjustments/Labs and Tests Ordered: Current medicines are reviewed at length with the patient today.  Concerns regarding medicines are outlined above.  Orders Placed This Encounter  Procedures  . Lipid  Profile  . EKG 12-Lead   Meds ordered this encounter  Medications  . rosuvastatin (CRESTOR) 20 MG tablet    Sig: Take 1 tablet (20 mg total) by mouth daily.    Dispense:  90 tablet    Refill:  2  Patient Instructions   Medication Instructions:   STOP TAKING ATORVASTATIN NOW  START TAKING ROSUVASTATIN (CRESTOR) 20 MG BY MOUTH DAILY  *If you need a refill on your cardiac medications before your next appointment, please call your pharmacy*   Lab Work:  IN [address] [place] OFFICE--WE [place] TIME--PLEASE COME FASTING TO THIS LAB APPOINTMENT   If you have labs (blood work) drawn today and your tests are completely normal, you will receive your results only by: Marland Kitchen MyChart Message (if you have MyChart) OR . A paper copy in the mail If you have any lab test that is abnormal or we need to change your treatment, we will call you to review the results.   Follow-Up:  3 MONTHS WITH DR. Johney Frame IN THE OFFICE     --Mediterranean Diet A Mediterranean diet refers to food and lifestyle choices that are based on the traditions of countries located on the [place]. This way of eating has been shown to help prevent certain conditions and improve outcomes for people who have chronic diseases, like kidney disease and heart disease. What are tips for following this plan? Lifestyle  Cook and eat meals together with your family, when possible.  Drink enough fluid to keep your urine clear or pale yellow.  Be physically active every day. This includes: ? Aerobic exercise like running or swimming. ? Leisure activities like gardening, walking, or housework.  Get 7-8 hours of sleep each night.  If recommended by your health care provider, drink red wine in moderation. This means 1 glass a day for nonpregnant women and 2 glasses a day for men. A glass of wine equals 5 oz (150 mL). Reading food labels  Check the serving size of packaged foods. For foods such  as rice and pasta, the serving size refers to the amount of cooked product, not dry.  Check the total fat in packaged foods. Avoid foods that have saturated fat or trans fats.  Check the ingredients list for added sugars, such as corn syrup.    Shopping  At the grocery store, buy most of your food from the areas near the walls of the store. This includes: ? Fresh fruits and vegetables (produce). ? Grains, beans, nuts, and seeds. Some of these may be available in unpackaged forms or large amounts (in bulk). ? Fresh seafood. ? Poultry and eggs. ? Low-fat dairy products.  Buy whole ingredients instead of prepackaged foods.  Buy fresh fruits and vegetables in-season from local farmers markets.  Buy frozen fruits and vegetables in resealable bags.  If you do not have access to quality fresh seafood, buy precooked frozen shrimp or canned fish, such as tuna, salmon, or sardines.  Buy small amounts of raw or cooked vegetables, salads, or olives from the deli or salad bar at your store.  Stock your pantry so you always have certain foods on hand, such as olive oil, canned tuna, canned tomatoes, rice, pasta, and beans. Cooking  Cook foods with extra-virgin olive oil instead of using butter or other vegetable oils.  Have meat as a side dish, and have vegetables or grains as your main dish. This means having meat in small portions or adding small amounts of meat to foods like pasta or stew.  Use beans or vegetables instead of meat in common dishes like chili or lasagna.  Experiment with different cooking methods. Try roasting or broiling vegetables instead of steaming or sauteing them.  Add frozen vegetables to  soups, stews, pasta, or rice.  Add nuts or seeds for added healthy fat at each meal. You can add these to yogurt, salads, or vegetable dishes.  Marinate fish or vegetables using olive oil, lemon juice, garlic, and fresh herbs. Meal planning  Plan to eat 1 vegetarian meal one  day each week. Try to work up to 2 vegetarian meals, if possible.  Eat seafood 2 or more times a week.  Have healthy snacks readily available, such as: ? Vegetable sticks with hummus. ? [place] yogurt. ? Fruit and nut trail mix.  Eat balanced meals throughout the week. This includes: ? Fruit: 2-3 servings a day ? Vegetables: 4-5 servings a day ? Low-fat dairy: 2 servings a day ? Fish, poultry, or lean meat: 1 serving a day ? Beans and legumes: 2 or more servings a week ? Nuts and seeds: 1-2 servings a day ? Whole grains: 6-8 servings a day ? Extra-virgin olive oil: 3-4 servings a day  Limit red meat and sweets to only a few servings a month    What are my food choices?  Mediterranean diet ? Recommended  Grains: Whole-grain pasta. Brown rice. Bulgar wheat. Polenta. Couscous. Whole-wheat bread. [place].  Vegetables: Artichokes. Beets. Broccoli. Cabbage. Carrots. Eggplant. Green beans. Chard. Kale. Spinach. Onions. Leeks. Peas. Squash. Tomatoes. Peppers. Radishes.  Fruits: Apples. Apricots. Avocado. Berries. Bananas. Cherries. Dates. Figs. Grapes. Lemons. Melon. Oranges. Peaches. Plums. Pomegranate.  Meats and other protein foods: Beans. Almonds. Sunflower seeds. Pine nuts. Peanuts. [place]. Salmon. Scallops. Shrimp. [place]. Tilapia. Clams. Oysters. Eggs.  Dairy: Low-fat milk. Cheese. Greek yogurt.  Beverages: Water. Red wine. Herbal tea.  Fats and oils: Extra virgin olive oil. Avocado oil. Grape seed oil.  Sweets and desserts: [place] yogurt with honey. Baked apples. Poached pears. Trail mix.  Seasoning and other foods: Basil. Cilantro. Coriander. Cumin. Mint. Parsley. Sage. Rosemary. Tarragon. Garlic. Oregano. Thyme. Pepper. Balsalmic vinegar. Tahini. Hummus. Tomato sauce. Olives. Mushrooms. ? Limit these  Grains: Prepackaged pasta or rice dishes. Prepackaged cereal with added sugar.  Vegetables: Deep fried potatoes (french fries).  Fruits: Fruit canned in  syrup.  Meats and other protein foods: Beef. Pork. Lamb. Poultry with skin. Hot dogs. Berniece Salines.  Dairy: Ice cream. Sour cream. Whole milk.  Beverages: Juice. Sugar-sweetened soft drinks. Beer. Liquor and spirits.  Fats and oils: Butter. Canola oil. Vegetable oil. Beef fat (tallow). Lard.  Sweets and desserts: Cookies. Cakes. Pies. Candy.  Seasoning and other foods: Mayonnaise. Premade sauces and marinades. The items listed may not be a complete list. Talk with your dietitian about what dietary choices are right for you. Summary  The Mediterranean diet includes both food and lifestyle choices.  Eat a variety of fresh fruits and vegetables, beans, nuts, seeds, and whole grains.  Limit the amount of red meat and sweets that you eat.  Talk with your health care provider about whether it is safe for you to drink red wine in moderation. This means 1 glass a day for nonpregnant women and 2 glasses a day for men. A glass of wine equals 5 oz (150 mL). This information is not intended to replace advice given to you by your health care provider. Make sure you discuss any questions you have with your health care provider. Document Revised: 06/26/2016 Document Reviewed: 06/19/2016 Elsevier Patient Education  2020 [place], Freada Bergeron, MD  01/22/2021 12:47 PM    [place]

## 2021-01-22 ENCOUNTER — Ambulatory Visit (INDEPENDENT_AMBULATORY_CARE_PROVIDER_SITE_OTHER): Payer: BLUE CROSS/BLUE SHIELD | Admitting: Cardiology

## 2021-01-22 ENCOUNTER — Encounter: Payer: Self-pay | Admitting: Cardiology

## 2021-01-22 ENCOUNTER — Other Ambulatory Visit: Payer: Self-pay

## 2021-01-22 VITALS — BP 122/84 | HR 71 | Ht 68.0 in | Wt 226.0 lb

## 2021-01-22 DIAGNOSIS — R0609 Other forms of dyspnea: Secondary | ICD-10-CM

## 2021-01-22 DIAGNOSIS — I2510 Atherosclerotic heart disease of native coronary artery without angina pectoris: Secondary | ICD-10-CM

## 2021-01-22 DIAGNOSIS — E785 Hyperlipidemia, unspecified: Secondary | ICD-10-CM | POA: Diagnosis not present

## 2021-01-22 DIAGNOSIS — I10 Essential (primary) hypertension: Secondary | ICD-10-CM | POA: Diagnosis not present

## 2021-01-22 DIAGNOSIS — R0600 Dyspnea, unspecified: Secondary | ICD-10-CM

## 2021-01-22 DIAGNOSIS — Z79899 Other long term (current) drug therapy: Secondary | ICD-10-CM | POA: Diagnosis not present

## 2021-01-22 MED ORDER — ROSUVASTATIN CALCIUM 20 MG PO TABS: 20.0000 mg | ORAL_TABLET | Freq: Every day | ORAL | 2 refills | Status: DC

## 2021-01-22 NOTE — Patient Instructions (Addendum)
Medication Instructions:   STOP TAKING ATORVASTATIN NOW  START TAKING ROSUVASTATIN (CRESTOR) 20 MG BY MOUTH DAILY  *If you need a refill on your cardiac medications before your next appointment, please call your pharmacy*   Lab Work:  IN [place] OFFICE--WE [place] TIME--PLEASE COME FASTING TO THIS LAB APPOINTMENT   If you have labs (blood work) drawn today and your tests are completely normal, you will receive your results only by: Marland Kitchen MyChart Message (if you have MyChart) OR . A paper copy in the mail If you have any lab test that is abnormal or we need to change your treatment, we will call you to review the results.   Follow-Up:  3 MONTHS WITH DR. Johney Frame IN THE OFFICE     --Mediterranean Diet A Mediterranean diet refers to food and lifestyle choices that are based on the traditions of countries located on the [place]. This way of eating has been shown to help prevent certain conditions and improve outcomes for people who have chronic diseases, like kidney disease and heart disease. What are tips for following this plan? Lifestyle  Cook and eat meals together with your family, when possible.  Drink enough fluid to keep your urine clear or pale yellow.  Be physically active every day. This includes: ? Aerobic exercise like running or swimming. ? Leisure activities like gardening, walking, or housework.  Get 7-8 hours of sleep each night.  If recommended by your health care provider, drink red wine in moderation. This means 1 glass a day for nonpregnant women and 2 glasses a day for men. A glass of wine equals 5 oz (150 mL). Reading food labels  Check the serving size of packaged foods. For foods such as rice and pasta, the serving size refers to the amount of cooked product, not dry.  Check the total fat in packaged foods. Avoid foods that have saturated fat or trans fats.  Check the ingredients list for added sugars, such as  corn syrup.    Shopping  At the grocery store, buy most of your food from the areas near the walls of the store. This includes: ? Fresh fruits and vegetables (produce). ? Grains, beans, nuts, and seeds. Some of these may be available in unpackaged forms or large amounts (in bulk). ? Fresh seafood. ? Poultry and eggs. ? Low-fat dairy products.  Buy whole ingredients instead of prepackaged foods.  Buy fresh fruits and vegetables in-season from local farmers markets.  Buy frozen fruits and vegetables in resealable bags.  If you do not have access to quality fresh seafood, buy precooked frozen shrimp or canned fish, such as tuna, salmon, or sardines.  Buy small amounts of raw or cooked vegetables, salads, or olives from the deli or salad bar at your store.  Stock your pantry so you always have certain foods on hand, such as olive oil, canned tuna, canned tomatoes, rice, pasta, and beans. Cooking  Cook foods with extra-virgin olive oil instead of using butter or other vegetable oils.  Have meat as a side dish, and have vegetables or grains as your main dish. This means having meat in small portions or adding small amounts of meat to foods like pasta or stew.  Use beans or vegetables instead of meat in common dishes like chili or lasagna.  Experiment with different cooking methods. Try roasting or broiling vegetables instead of steaming or sauteing them.  Add frozen vegetables to soups, stews, pasta, or  rice.  Add nuts or seeds for added healthy fat at each meal. You can add these to yogurt, salads, or vegetable dishes.  Marinate fish or vegetables using olive oil, lemon juice, garlic, and fresh herbs. Meal planning  Plan to eat 1 vegetarian meal one day each week. Try to work up to 2 vegetarian meals, if possible.  Eat seafood 2 or more times a week.  Have healthy snacks readily available, such as: ? Vegetable sticks with hummus. ? [place] yogurt. ? Fruit and nut trail  mix.  Eat balanced meals throughout the week. This includes: ? Fruit: 2-3 servings a day ? Vegetables: 4-5 servings a day ? Low-fat dairy: 2 servings a day ? Fish, poultry, or lean meat: 1 serving a day ? Beans and legumes: 2 or more servings a week ? Nuts and seeds: 1-2 servings a day ? Whole grains: 6-8 servings a day ? Extra-virgin olive oil: 3-4 servings a day  Limit red meat and sweets to only a few servings a month    What are my food choices?  Mediterranean diet ? Recommended  Grains: Whole-grain pasta. Brown rice. Bulgar wheat. Polenta. Couscous. Whole-wheat bread. [place].  Vegetables: Artichokes. Beets. Broccoli. Cabbage. Carrots. Eggplant. Green beans. Chard. Kale. Spinach. Onions. Leeks. Peas. Squash. Tomatoes. Peppers. Radishes.  Fruits: Apples. Apricots. Avocado. Berries. Bananas. Cherries. Dates. Figs. Grapes. Lemons. Melon. Oranges. Peaches. Plums. Pomegranate.  Meats and other protein foods: Beans. Almonds. Sunflower seeds. Pine nuts. Peanuts. [place]. Salmon. Scallops. Shrimp. [place]. Tilapia. Clams. Oysters. Eggs.  Dairy: Low-fat milk. Cheese. Greek yogurt.  Beverages: Water. Red wine. Herbal tea.  Fats and oils: Extra virgin olive oil. Avocado oil. Grape seed oil.  Sweets and desserts: [place] yogurt with honey. Baked apples. Poached pears. Trail mix.  Seasoning and other foods: Basil. Cilantro. Coriander. Cumin. Mint. Parsley. Sage. Rosemary. Tarragon. Garlic. Oregano. Thyme. Pepper. Balsalmic vinegar. Tahini. Hummus. Tomato sauce. Olives. Mushrooms. ? Limit these  Grains: Prepackaged pasta or rice dishes. Prepackaged cereal with added sugar.  Vegetables: Deep fried potatoes (french fries).  Fruits: Fruit canned in syrup.  Meats and other protein foods: Beef. Pork. Lamb. Poultry with skin. Hot dogs. Berniece Salines.  Dairy: Ice cream. Sour cream. Whole milk.  Beverages: Juice. Sugar-sweetened soft drinks. Beer. Liquor and spirits.  Fats and oils: Butter.  Canola oil. Vegetable oil. Beef fat (tallow). Lard.  Sweets and desserts: Cookies. Cakes. Pies. Candy.  Seasoning and other foods: Mayonnaise. Premade sauces and marinades. The items listed may not be a complete list. Talk with your dietitian about what dietary choices are right for you. Summary  The Mediterranean diet includes both food and lifestyle choices.  Eat a variety of fresh fruits and vegetables, beans, nuts, seeds, and whole grains.  Limit the amount of red meat and sweets that you eat.  Talk with your health care provider about whether it is safe for you to drink red wine in moderation. This means 1 glass a day for nonpregnant women and 2 glasses a day for men. A glass of wine equals 5 oz (150 mL). This information is not intended to replace advice given to you by your health care provider. Make sure you discuss any questions you have with your health care provider. Document Revised: 06/26/2016 Document Reviewed: 06/19/2016 Elsevier Patient Education  [place].

## 2021-03-05 ENCOUNTER — Other Ambulatory Visit: Payer: BLUE CROSS/BLUE SHIELD

## 2021-03-07 ENCOUNTER — Other Ambulatory Visit: Payer: Self-pay

## 2021-03-07 ENCOUNTER — Other Ambulatory Visit: Payer: BLUE CROSS/BLUE SHIELD

## 2021-03-07 DIAGNOSIS — E785 Hyperlipidemia, unspecified: Secondary | ICD-10-CM

## 2021-03-07 DIAGNOSIS — Z79899 Other long term (current) drug therapy: Secondary | ICD-10-CM

## 2021-03-07 LAB — LIPID PANEL
Chol/HDL Ratio: 3.8 ratio (ref 0.0–5.0)
Cholesterol, Total: 255 mg/dL — ABNORMAL HIGH (ref 100–199)
HDL: 67 mg/dL (ref >39)
LDL Chol Calc (NIH): 158 mg/dL — ABNORMAL HIGH (ref 0–99)
Triglycerides: 166 mg/dL — ABNORMAL HIGH (ref 0–149)
VLDL Cholesterol Cal: 30 mg/dL (ref 5–40)

## 2021-03-08 ENCOUNTER — Telehealth: Payer: Self-pay

## 2021-03-08 DIAGNOSIS — E785 Hyperlipidemia, unspecified: Secondary | ICD-10-CM

## 2021-03-08 MED ORDER — ROSUVASTATIN CALCIUM 40 MG PO TABS: 40.0000 mg | ORAL_TABLET | Freq: Every day | ORAL | 3 refills | Status: DC

## 2021-03-08 NOTE — Telephone Encounter (Signed)
The patient has been notified of the result and verbalized understanding.  All questions (if any) were answered. Antonieta Iba, RN 03/08/2021 10:46 AM  Patient reports that he has been taking his Crestor 20 mg daily. He will increase it to 40 mg per day. Repeat lab work when he comes back to see Dr. Johney Frame 6/27.

## 2021-03-08 NOTE — Telephone Encounter (Signed)
-----   Message from Freada Bergeron, MD sent at 03/08/2021  3:05 AM EDT ----- His cholesterol remains very elevated with LDL 158 (goal is <70). Can we increase to 40mg  daily (if he is taking the crestor as prescribed) and repeat cholesterol in 6 weeks. If needed, we can add zetia in the future.

## 2021-03-09 LAB — LDL CHOLESTEROL, DIRECT

## 2021-03-11 LAB — LDL CHOLESTEROL, DIRECT: LDL Direct: 162 mg/dL — ABNORMAL HIGH (ref 0–99)

## 2021-03-11 LAB — SPECIMEN STATUS REPORT

## 2021-03-13 ENCOUNTER — Telehealth: Payer: Self-pay | Admitting: *Deleted

## 2021-03-13 DIAGNOSIS — E7889 Other lipoprotein metabolism disorders: Secondary | ICD-10-CM

## 2021-03-13 DIAGNOSIS — I2510 Atherosclerotic heart disease of native coronary artery without angina pectoris: Secondary | ICD-10-CM

## 2021-03-13 DIAGNOSIS — Z79899 Other long term (current) drug therapy: Secondary | ICD-10-CM

## 2021-03-13 DIAGNOSIS — E785 Hyperlipidemia, unspecified: Secondary | ICD-10-CM

## 2021-03-13 NOTE — Telephone Encounter (Signed)
Freada Bergeron, MD  03/12/2021 12:51 PM EDT      His LDL is still very elevated (goal <70). Can we increase his crestor to 40mg  daily and get him in with lipid clinic as well. Thank you!    Spoke with the pt.  He states someone already called him and endorsed the need for him to increase his Crestor to 40 mg po daily, and he will come in for repeat fasting labs, same day as he see's Dr. Johney Frame in clinic, on 6/27, for he states he was not fasting correctly for this lab test. Also endorsed to him that Dr. Johney Frame wants to refer him to our lipid clinic, for further management.  He is aware I will place the referral in the system and our [facility] schedulers will call him back and arrange this appt for him at a good time.   Pt verbalized understanding and agrees with this plan.

## 2021-03-28 ENCOUNTER — Ambulatory Visit: Payer: BLUE CROSS/BLUE SHIELD

## 2021-04-11 ENCOUNTER — Ambulatory Visit (INDEPENDENT_AMBULATORY_CARE_PROVIDER_SITE_OTHER): Payer: BLUE CROSS/BLUE SHIELD | Admitting: Pharmacist

## 2021-04-11 ENCOUNTER — Other Ambulatory Visit: Payer: Self-pay

## 2021-04-11 DIAGNOSIS — I25119 Atherosclerotic heart disease of native coronary artery with unspecified angina pectoris: Secondary | ICD-10-CM

## 2021-04-11 DIAGNOSIS — E785 Hyperlipidemia, unspecified: Secondary | ICD-10-CM | POA: Diagnosis not present

## 2021-04-11 MED ORDER — ROSUVASTATIN CALCIUM 40 MG PO TABS: 40.0000 mg | ORAL_TABLET | Freq: Every day | ORAL | 3 refills | Status: DC

## 2021-04-11 NOTE — Patient Instructions (Signed)
It was a pleasure to meet you!  Please try to limit your breads, muffins, bagels, pasta intake Read food labels and watch for added sugars Try to avoid artifical sweeteners as well  I will call you when I hear back from your insurance company  Please call me at [phone]  TIPS for Living a healthier life  SUGAR  Sugar is a huge problem in the modern day diet. Sugar is a HUGE contributor to heart disease, diabetes, high triglyceride levels, fatty liver diease and obesity. Sugar is hidden in almost all packaged foods/beverages. Added sugar is extra sugar that is added beyond what is naturally found. It adds no nutritional benefit to your body and can cause major harm. The American Heart Association recommends limiting added sugars to no more than 25g for women and 36 grams for men per day.  There are many names for sugar maltose, sucrose (names ending in "ose"), high fructose corn syrup, molasses, cane sugar, corn sweetener, raw sugar, syrup, honey or fruit juice concentrate.   One of the best ways to limit your added sugars is to stop drinking sweetened beverages such as soda, sweet tea, fruit juice or fancy coffee's. There is 65g of added sugars in one 20oz bottle of Coke!! That is equal to 6 donuts.   Pay attention and read all nutrition facts labels. Below is an examples of a nutrition facts label. The #1 is showing you the total sugars where the # 2 is showing you the added sugars. This one severing has almost the max amount of added sugars per day!  Watch out for items that say "low fat" or "no added sugar" as these products are typically very high in sugar. The food industry uses these terms to fool you into thinking they are healthy.  For more information on the dangers of sugar watch WHY Sugar is as Bad as Alcohol (Fructose, The Liver Toxin) on YouTube.    EXERCISE  Exercise is good. We've all heard that. In an ideal world, we would all have time and resources to  get plenty  of it. When you are active your heart pumps more efficiently and you will feel better.  Multiple studies show that even walking regularly has benefits that include living a longer life.  The American Heart Association recommends 90-150 minutes per week of exercise (30 minutes  per day most days of the week). You can do this in any increment you wish. Nine or more  10-minute walks count. So does an hour-long exercise class. Break the time apart into what will  work in your life. Some of the best things you can do include walking briskly, jogging, cycling or  swimming laps. Not everyone is ready to "exercise." Sometimes we need to start with just getting active. Here  are some easy ways to be more active throughout the day: Marland Kitchen Take the stairs instead of the elevator . Go for a 10-15 minute walk during your lunch break (find a friend to make it more enjoyable) . When shopping, park at the back of the parking lot . If you take public transportation, get off one stop early and walk the extra distance . Pace around while making phone calls (most of [place] are not attached to phone cords any longer!) Check with your doctor if you aren't sure what your limitations may be. Always remember to drink plenty of water when doing any type of exercise. Don't feel like a failure if you're not getting the 90-150  minutes per week. If you started by being  a couch potato, then just a 10-minute walk each day is a huge improvement. Start with little  victories and work your way up.   Healthy Eating Tips  When looking to improve your eating habits, whether to lose weight, lower blood pressure or just be healthier, it helps to know what a serving size is.   Grains 1 slice of bread,  bagel,  cup pasta or rice  Vegetables 1 cup fresh or raw vegetables,  cup cooked or canned Fruits 1 piece of medium sized fruit,  cup canned,   Meats/Proteins  cup dried       1 oz meat, 1 egg,  cup cooked beans, nuts or  seeds  Dairy        Fats Individual yogurt container, 1 cup (8oz)    1 teaspoon margarine/butter or vegetable  milk or milk alternative, 1 slice of cheese          oil; 1 tablespoon mayonnaise or salad dressing                  Plan ahead: make a menu of the meals for a week then create a grocery list to go with  that menu. Consider meals that easily stretch into a night of leftovers, such as stews or  casseroles. Or consider making two of your favorite meal and put one in the freezer or fridge for  another night.  When you get home from the grocery store wash and prepare your vegetables and fruits.  Then when you need them they are ready to go.  Tips for going to the grocery store: . Buy store or generic brands . Check the weekly ad from your store on-line or in their in-store flyer . Look at the unit price on the shelf tag to compare/contrast the costs of different items . Buy fruits/vegetables in season . Carrots, bananas and apples are low-cost, naturally healthy items . If meats or frozen vegetables are on sale, buy some extras and put in your freezer . Limit buying prepared or "ready to eat" items, even if they are pre-made salads or fruit snacks . Do not shop when you're hungry . Foods at eye level tend to be more expensive. Look on the high and low shelves for deals. . Consider shopping at the farmer's market for fresh foods in season. . Choose canned tuna or salmon instead of fresh . Avoid the cookie and chip aisles (these are expensive, high in calories and low in  nutritional value). Shop on the outside of the grocery store.  Aim to have one 12 hour fast each day. This means no eating after dinner until breakfast. For example, if you eat dinner around 6 PM then you would not eat anything until 6 AM the next day. This is a great way to help lower your insulin levels, lose weight and reduce your blood pressure.   Healthy food preparations: . If you can't get lean hamburger,  be sure to drain the fat when cooking . Steam, saut (in olive oil), grill or bake foods . Experiment with different seasonings to avoid adding salt to your foods. Kosher salt, sea salt and Himalayan salt are all still salt and should be avoided Try seasoning food with onion, garlic, thyme, rosemary, basil ect. Onion powder or garlic powder is ok. Avoid if it says salt (ie garlic salt).        Resources: American Heart Association -  InstantFinish.fi Go to the Healthy Living tab to get more information American Diabetes Association - www.diabetes.org You don't have to be diabetic - check out the Food and Fitness tab

## 2021-04-11 NOTE — Progress Notes (Signed)
Patient ID: Isaac Summers                 DOB: February 10, 1971                    MRN: [id number]     HPI: Isaac Summers is a 50 y.o. male patient referred to lipid clinic by Dr. Johney Frame. PMH is significant for HTN, HLD, GERD, asthma, DOE and elevated CAC score of 231. Patient saw Dr. Johney Frame on 01/22/21. His atorvastatin 20mg  was changed to rosuvastatin 20mg  daily. Repeat lipid panel showed a direct LDL of 162. His rosuvastatin was increased to 40mg  and he was referred to the lipid clinic for further management.  Patient presents today to lipid clinic. He denies any issues with rosuvastatin. No past intolerances. He states that his diet is getting better as he does not travel as much for work anymore. Exercises 4-5 days a week.   Current Medications: rosuvastatin 40mg  daily Intolerances:none  Risk Factors: family hx of premature CAD, elevated CAC score, HTN LDL goal:<70 or 28 (given his young age) Diet:  Breakfast: egg whites and bagel or english muffin- sometime ham Lunch: chicken or salmon with salad (ranch) or vegetables  Dinner: chicken or salmon (fish) occasional steak  Vegetable and some kind of rice (rice packets) Snack: mixed nuts, not a huge sweets person but it is always around Drinks: seltzer, diet ginger ale, occasional sweet tea, wine or manhatten   Exercise: 4-5x per week, either cycling or youtube HITT, lift weights, walk, elipical  Family History: Father with MI at 45 and multiple stents, mother with CAD, CVA  Social History: cigars in the past, not currently, alcohol 1-2 drinks per day  Labs: 03/07/21 TC 255, TG 166, HDL 67, direct LDL 162 (rosuvastatin 20mg - not fasting)  Past Medical History:  Diagnosis Date  . Allergic rhinitis   . Arthritis   . GERD (gastroesophageal reflux disease)   . Gout    per pt as of 07-10-2017-- stable  . History of kidney stones   . History of malignant melanoma of skin 2007   2007  left chest wall s/p  WLE/  2008 left shoulder  area  . Hyperlipidemia   . Hypertension   . Left ureteral stone   . Mild intermittent asthma   . Urgency of urination   . Wears glasses     Current Outpatient Medications on File Prior to Visit  Medication Sig Dispense Refill  . acetaminophen (TYLENOL) 500 MG tablet Take 1,000 mg by mouth every 6 (six) hours as needed for mild pain or moderate pain.    Marland Kitchen aspirin EC 81 MG tablet Take 81 mg by mouth daily.    . celecoxib (CELEBREX) 200 MG capsule Take 200 mg by mouth daily as needed for pain.    Marland Kitchen colchicine 0.6 MG tablet Take 0.6 mg by mouth 2 (two) times daily as needed (gout).     . Dexlansoprazole 30 MG capsule Take 30 mg by mouth daily.    Marland Kitchen EPINEPHrine 0.3 mg/0.3 mL IJ SOAJ injection Inject 0.3 mg into the muscle as needed for anaphylaxis.    Marland Kitchen esomeprazole (NEXIUM) 20 MG capsule Take 20 mg by mouth as needed.    . febuxostat (ULORIC) 40 MG tablet Take 1 tablet by mouth daily.    . fluticasone (FLONASE) 50 MCG/ACT nasal spray Place 2 sprays into both nostrils 2 (two) times daily as needed for allergies or rhinitis.    Marland Kitchen ibuprofen (  ADVIL,MOTRIN) 200 MG tablet Take 400 mg by mouth every 6 (six) hours as needed for mild pain or moderate pain.    Marland Kitchen olmesartan (BENICAR) 40 MG tablet Take 40 mg by mouth every morning.   4  . PROAIR HFA 108 (90 Base) MCG/ACT inhaler Take 2 puffs by mouth every 6 (six) hours as needed for wheezing or shortness of breath.    . rosuvastatin (CRESTOR) 40 MG tablet Take 1 tablet (40 mg total) by mouth daily. 90 tablet 3  . sertraline (ZOLOFT) 50 MG tablet Take 50 mg by mouth every morning.     . sulfamethoxazole-trimethoprim (BACTRIM DS,SEPTRA DS) 800-160 MG tablet Take 1 tablet by mouth 2 (two) times daily. 8 tablet 0  . tamsulosin (FLOMAX) 0.4 MG CAPS capsule Take 0.4 mg by mouth daily after breakfast.      No current facility-administered medications on file prior to visit.    Allergies  Allergen Reactions  . Other Shortness Of Breath    Tree nuts---   sob    Assessment/Plan:  1. Hyperlipidemia - LDL is above goal of <70. LDL will not get to goal with the increased dose of rosuvastatin. Therefore, will add on PCSK9i. I have submitted a prior authorization to patients insurance for Repatha 140mg  every 2 weeks. Awaiting response from insurance. Patient was educated on proper injection technique, efficacy and side effects. He is in agreement with the plan. Diet also reviewed with patient. Recommended he decrease unhealthy carbs like breads, bagels, pasta. Watch for added sugars in packaged items and avoid artifical sweeteners as well. I will call patient once I hear back from insurance and will get him a copay card. Repeat labs on 8/15.   Thank you,   Ramond Dial, Pharm.D, BCPS, CPP [place]  [address], [place], [place] [address]  Phone: [phone]; Fax: [phone]

## 2021-04-12 ENCOUNTER — Other Ambulatory Visit: Payer: Self-pay | Admitting: Cardiology

## 2021-04-12 ENCOUNTER — Telehealth: Payer: Self-pay | Admitting: Pharmacist

## 2021-04-12 ENCOUNTER — Encounter: Payer: Self-pay | Admitting: *Deleted

## 2021-04-12 ENCOUNTER — Encounter (HOSPITAL_COMMUNITY): Payer: Self-pay | Admitting: Cardiology

## 2021-04-12 DIAGNOSIS — R079 Chest pain, unspecified: Secondary | ICD-10-CM

## 2021-04-12 MED ORDER — REPATHA SURECLICK 140 MG/ML ~~LOC~~ SOAJ: 1.0000 "pen " | SUBCUTANEOUS | 11 refills | Status: DC

## 2021-04-12 NOTE — Telephone Encounter (Signed)
PA request for Repatha was denied bc pt LDL is not >220 and he does not have a CAC of >300. Will write an appeals letter to appeal decision. Patient made aware.

## 2021-04-12 NOTE — Telephone Encounter (Signed)
Resubmitted for ASCVD indication. Repatha now approved through 04/12/22.  RxBin: [id number] RxPCN: [id number] RxGrp: [id number] ID: [id number]  Copay card info called into pharmacy  Patient made aware

## 2021-04-17 ENCOUNTER — Telehealth (HOSPITAL_COMMUNITY): Payer: Self-pay | Admitting: *Deleted

## 2021-04-17 NOTE — Telephone Encounter (Signed)
Left message on voicemail per DPR in reference to upcoming appointment scheduled on 04/25/21 with detailed instructions given per Myocardial Perfusion Study Information Sheet for the test. LM to arrive 15 minutes early, and that it is imperative to arrive on time for appointment to keep from having the test rescheduled. If you need to cancel or reschedule your appointment, please call the office within 24 hours of your appointment. Failure to do so may result in a cancellation of your appointment, and a $50 no show fee. Phone number given for call back for any questions. Kirstie Peri

## 2021-04-24 NOTE — Progress Notes (Signed)
Cardiology Office Note:    Date:  05/06/2021   ID:  Isaac Summers, DOB 05-Apr-1971, MRN [id number]  PCP:  Chesley Noon, MD   [place]  Cardiologist:  None  Advanced Practice Provider:  No care team member to display Electrophysiologist:  None   Referring MD: Chesley Noon, MD    History of Present Illness:    Isaac Summers is a 50 y.o. male with a hx of HTN, HLD, GERD and asthma who was referred by Dr. Melford Aase for further evaluation of elevated coronary calcium score.  Seen by Dr. Burt Knack in 2018 for abnormal ECG and SOB. Myoview at that time negative for ischemia.  Saw Dr. Melford Aase 12/10/20 for DOE. He was recommended for coronary calcium score which demonstrated Ca score 231, LM 0, LAD 97, Cx 20, RCA 114 prompting referral to Cardiology.  Was last seen in clinic by me in 01/22/21 where he was complaining of DOE when walking inclines. Was able to tolerate cycling and HITT classes without issues. LDL was 158. We trialed switching atorvastatin to crestor but LDL remained above goal <70. We therefore started him on repatha at that time. He then called due to an episode of chest pain while at his [place]. Myoview 04/25/21 with normal LVEF, no perfusion abnormalities.   Today, the patient states that he is overall doing well. No further episodes of chest discomfort since he called the office prompting the stress test as detailed above. Thinks his symptoms may be related to allergies/asthma as he has severe seasonal allergies. He remains very active and is able to exercise without issues (cycling classes, walking, going to the gym and doing elliptical; currently exercising 4-5x/week). No LE edema, orthopnea, PND or dizziness. Tolerating repatha without issues. Blood pressure is well controlled at home. No dizziness, lightheadedness or syncope.  Father with MI at 49 and multiple stents, mother with CAD   Past Medical History:  Diagnosis Date    Allergic rhinitis    Arthritis    GERD (gastroesophageal reflux disease)    Gout    per pt as of 07-10-2017-- stable   History of kidney stones    History of malignant melanoma of skin 2007   2007  left chest wall s/p  WLE/  2008 left shoulder area   Hyperlipidemia    Hypertension    Left ureteral stone    Mild intermittent asthma    Urgency of urination    Wears glasses     Past Surgical History:  Procedure Laterality Date   CYSTOSCOPY W/ RETROGRADES Left 07/16/2017   Procedure: CYSTOSCOPY WITH RETROGRADE PYELOGRAM;  Surgeon: Cleon Gustin, MD;  Location: [facility];  Service: Urology;  Laterality: Left;   CYSTOSCOPY W/ URETERAL STENT PLACEMENT Left 06/19/2017   Procedure: CYSTOSCOPY WITH RETROGRADE PYELOGRAM/URETERAL STENT PLACEMENT;  Surgeon: Cleon Gustin, MD;  Location: [facility];  Service: Urology;  Laterality: Left;   CYSTOSCOPY/URETEROSCOPY/HOLMIUM LASER/STENT PLACEMENT Left 07/16/2017   Procedure: CYSTOSCOPY/URETEROSCOPY/HOLMIUM LASER/STENT EXCHANGE;  Surgeon: Cleon Gustin, MD;  Location: [facility];  Service: Urology;  Laterality: Left;  NEEDS TOTAL 30 MIN   KNEE ARTHROSCOPY Right 2013 approx.   LEFT SHOULDER RECONSTRUCTION  1992   WIDE LOCAL EXCISION LEFT CHEST WALL  06/26/2006   malignant melanoma    Current Medications: Current Meds  Medication Sig   acetaminophen (TYLENOL) 500 MG tablet Take 1,000 mg by mouth every 6 (six) hours as needed for mild pain  or moderate pain.   aspirin EC 81 MG tablet Take 81 mg by mouth daily.   celecoxib (CELEBREX) 200 MG capsule Take 200 mg by mouth daily as needed for pain.   colchicine 0.6 MG tablet Take 0.6 mg by mouth 2 (two) times daily as needed (gout).    Dexlansoprazole 30 MG capsule Take 30 mg by mouth daily.   EPINEPHrine 0.3 mg/0.3 mL IJ SOAJ injection Inject 0.3 mg into the muscle as needed for anaphylaxis.   esomeprazole (NEXIUM) 20 MG capsule Take 20 mg by mouth as needed.    Evolocumab (REPATHA SURECLICK) 707 MG/ML SOAJ Inject 1 pen into the skin every 14 (fourteen) days.   febuxostat (ULORIC) 40 MG tablet Take 1 tablet by mouth daily.   fluticasone (FLONASE) 50 MCG/ACT nasal spray Place 2 sprays into both nostrils 2 (two) times daily as needed for allergies or rhinitis.   ibuprofen (ADVIL,MOTRIN) 200 MG tablet Take 400 mg by mouth every 6 (six) hours as needed for mild pain or moderate pain.   olmesartan (BENICAR) 40 MG tablet Take 40 mg by mouth every morning.    PROAIR HFA 108 (90 Base) MCG/ACT inhaler Take 2 puffs by mouth every 6 (six) hours as needed for wheezing or shortness of breath.   rosuvastatin (CRESTOR) 40 MG tablet Take 1 tablet (40 mg total) by mouth daily.   sertraline (ZOLOFT) 50 MG tablet Take 50 mg by mouth every morning.      Allergies:   Other   Social History   Socioeconomic History   Marital status: Married    Spouse name: Not on file   Number of children: Not on file   Years of education: Not on file   Highest education level: Not on file  Occupational History   Not on file  Tobacco Use   Smoking status: Never   Smokeless tobacco: Never  Vaping Use   Vaping Use: Never used  Substance and Sexual Activity   Alcohol use: Yes    Alcohol/week: 3.0 - 4.0 standard drinks    Types: 3 - 4 Shots of liquor per week   Drug use: No   Sexual activity: Not on file    Comment: 2006--- vasectomy (office)  Other Topics Concern   Not on file  Social History Narrative   Not on file   Social Determinants of Health   Financial Resource Strain: Not on file  Food Insecurity: Not on file  Transportation Needs: Not on file  Physical Activity: Not on file  Stress: Not on file  Social Connections: Not on file     Family History: The patient's family history includes CAD in his father; CVA in his mother; Hypertension in his brother and mother.  ROS:   Please see the history of present illness.    Review of Systems  Constitutional:   Negative for chills and fever.  HENT:  Negative for hearing loss.   Eyes:  Negative for blurred vision.  Respiratory:  Negative for shortness of breath.   Cardiovascular:  Negative for chest pain, palpitations, orthopnea, claudication, leg swelling and PND.  Gastrointestinal:  Negative for nausea and vomiting.  Genitourinary:  Negative for dysuria.  Musculoskeletal:  Negative for falls and myalgias.  Neurological:  Negative for dizziness and loss of consciousness.  Endo/Heme/Allergies:  Positive for environmental allergies.  Psychiatric/Behavioral:  Negative for substance abuse.    EKGs/Labs/Other Studies Reviewed:    The following studies were reviewed today: Myoview 04/25/21:  Nuclear stress EF: 56%.  The left ventricular ejection fraction is normal (55-65%). Blood pressure demonstrated a normal response to exercise. There was no ST segment deviation noted during stress. The study is normal. This is a low risk study.   Exercise time: 9 min Workload: 10.1 METS, average exercise capacity Test stopped due to: fatigue BP response: elevated without true hypertensive response to exercise HR response: normal Rhythm: SR   No ischemia on treadmill ECG. No ischemia or infarction on perfusion images. Normal wall motion. Low risk study.  Coronary Calcium Score 12/17/20: FINDINGS:   Calcium score: 231.  LM: 0.  LAD: 97.  Cx: 20.  RCA: 114   Heart size is Normal  Visible lung fields are: clear  Lymph nodes: Not enlarged  Abdomen: Fatty liver    Myoview 2018: Nuclear stress EF: 63%. There was no ST segment deviation noted during stress. Blood pressure demonstrated a normal response to exercise. The study is normal. The left ventricular ejection fraction is normal (55-65%).   1. EF 63% with normal wall motion.  2. Normal perfusion images, no evidence for ischemia/infarction.    Normal study.    EKG:  No new ECG today  Recent Labs: No results found for requested labs  within last 8760 hours.  Recent Lipid Panel    Component Value Date/Time   CHOL 255 (H) 03/07/2021 0842   TRIG 166 (H) 03/07/2021 0842   HDL 67 03/07/2021 0842   CHOLHDL 3.8 03/07/2021 0842   LDLCALC 158 (H) 03/07/2021 0842   LDLDIRECT 162 (H) 03/07/2021 4742    Physical Exam:    VS:  BP 120/70   Pulse 84   Ht 5\' 8"  (1.727 m)   Wt 223 lb 9.6 oz (101.4 kg)   SpO2 96%   BMI 34.00 kg/m     Wt Readings from Last 3 Encounters:  05/06/21 223 lb 9.6 oz (101.4 kg)  04/25/21 226 lb (102.5 kg)  01/22/21 226 lb (102.5 kg)     GEN:  Comfortable, NAD HEENT: Normal NECK: No JVD; No carotid bruits CARDIAC: RRR, no murmurs RESPIRATORY:  Clear to auscultation without rales, wheezing or rhonchi  ABDOMEN: Soft, non-tender, non-distended MUSCULOSKELETAL:  No edema; No deformity  SKIN: Warm and dry NEUROLOGIC:  Alert and oriented x 3 PSYCHIATRIC:  Normal affect   ASSESSMENT:    1. Coronary artery disease involving native coronary artery of native heart without angina pectoris   2. Dyspnea on exertion   3. Primary hypertension   4. Hyperlipidemia, unspecified hyperlipidemia type     PLAN:    In order of problems listed above:  #Coronary artery calcification: Ca score noted to be 231. 97 LAD, 114 RCA. Myoview obtained due to SOB while mowing the lawn and walking an incline which showed normal exercise capacity and no perfusion defects. LDL was not at goal of <70 and now initiated on repatha in addition to crestor.  -Myoview negative for ischemia/infarction -Continue repatha 140 q 2weeks -Continue crestor 40mg  daily -Continue ASA 81mg  daily -Continue exercise and healthy diet  #HLD: Goal <70. Now on repatha and crestor. Followed by lipid clinic. -Continue repatha 140 q 2weeks -Continue crestor 40mg  daily -Repeat lipids in August  #HTN:  Well controlled. -Continue olmesartan 40mg  daily  #Suspected orthostasis: Improved. Patient with episodes of lightheadedness after  mowing the lawn and rapid change in positions. Admits to poor fluid intake. Suspect orthostasis like symptoms and recommended increasing fluid intake as well as added electrolytes such as power aide zero or gatorade zero  with exercise. -Ischemic work-up reassuring -Continue fluid intake   Medication Adjustments/Labs and Tests Ordered: Current medicines are reviewed at length with the patient today.  Concerns regarding medicines are outlined above.  No orders of the defined types were placed in this encounter.  No orders of the defined types were placed in this encounter.   Patient Instructions  Medication Instructions:   Your physician recommends that you continue on your current medications as directed. Please refer to the Current Medication list given to you today.  *If you need a refill on your cardiac medications before your next appointment, please call your pharmacy*   Follow-Up: At [facility], you and your health needs are our priority.  As part of our continuing mission to provide you with exceptional heart care, we have created designated Provider Care Teams.  These Care Teams include your primary Cardiologist (physician) and Advanced Practice Providers (APPs -  Physician Assistants and Nurse Practitioners) who all work together to provide you with the care you need, when you need it.  We recommend signing up for the patient portal called "MyChart".  Sign up information is provided on this After Visit Summary.  MyChart is used to connect with patients for Virtual Visits (Telemedicine).  Patients are able to view lab/test results, encounter notes, upcoming appointments, etc.  Non-urgent messages can be sent to your provider as well.   To learn more about what you can do with MyChart, go to [email or web address].    Your next appointment:   1 year(s)  The format for your next appointment:   In Person  Provider:   Gwyndolyn Kaufman, MD     Signed, Freada Bergeron, MD  05/06/2021 9:00 AM    [place]

## 2021-04-25 ENCOUNTER — Other Ambulatory Visit: Payer: Self-pay

## 2021-04-25 ENCOUNTER — Ambulatory Visit (HOSPITAL_COMMUNITY): Payer: BLUE CROSS/BLUE SHIELD | Attending: Internal Medicine

## 2021-04-25 DIAGNOSIS — I25119 Atherosclerotic heart disease of native coronary artery with unspecified angina pectoris: Secondary | ICD-10-CM | POA: Diagnosis present

## 2021-04-25 LAB — MYOCARDIAL PERFUSION IMAGING
Estimated workload: 10.1 METS
Exercise duration (min): 9 min
Exercise duration (sec): 0 s
LV dias vol: 71 mL (ref 62–150)
LV sys vol: 31 mL
MPHR: 169 {beats}/min
Peak HR: 155 {beats}/min
Percent HR: 91 %
Rest HR: 62 {beats}/min
SDS: 1
SRS: 0
SSS: 1
TID: 0.8

## 2021-04-25 MED ORDER — TECHNETIUM TC 99M TETROFOSMIN IV KIT
10.9000 | PACK | Freq: Once | INTRAVENOUS | Status: AC | PRN
  Administered 2021-04-25: 10.9 via INTRAVENOUS
  Filled 2021-04-25: qty 11

## 2021-04-25 MED ORDER — TECHNETIUM TC 99M TETROFOSMIN IV KIT
32.4000 | PACK | Freq: Once | INTRAVENOUS | Status: AC | PRN
  Administered 2021-04-25: 32.4 via INTRAVENOUS
  Filled 2021-04-25: qty 33

## 2021-05-06 ENCOUNTER — Other Ambulatory Visit: Payer: BLUE CROSS/BLUE SHIELD

## 2021-05-06 ENCOUNTER — Ambulatory Visit: Payer: BLUE CROSS/BLUE SHIELD | Admitting: Cardiology

## 2021-05-06 ENCOUNTER — Encounter: Payer: Self-pay | Admitting: Cardiology

## 2021-05-06 ENCOUNTER — Other Ambulatory Visit: Payer: Self-pay

## 2021-05-06 VITALS — BP 120/70 | HR 84 | Ht 68.0 in | Wt 223.6 lb

## 2021-05-06 DIAGNOSIS — I10 Essential (primary) hypertension: Secondary | ICD-10-CM | POA: Diagnosis not present

## 2021-05-06 DIAGNOSIS — R0600 Dyspnea, unspecified: Secondary | ICD-10-CM

## 2021-05-06 DIAGNOSIS — E785 Hyperlipidemia, unspecified: Secondary | ICD-10-CM | POA: Diagnosis not present

## 2021-05-06 DIAGNOSIS — I2510 Atherosclerotic heart disease of native coronary artery without angina pectoris: Secondary | ICD-10-CM

## 2021-05-06 DIAGNOSIS — R0609 Other forms of dyspnea: Secondary | ICD-10-CM

## 2021-05-06 NOTE — Patient Instructions (Signed)

## 2021-06-24 ENCOUNTER — Other Ambulatory Visit: Payer: BLUE CROSS/BLUE SHIELD | Admitting: *Deleted

## 2021-06-24 ENCOUNTER — Other Ambulatory Visit: Payer: Self-pay

## 2021-06-24 DIAGNOSIS — E785 Hyperlipidemia, unspecified: Secondary | ICD-10-CM

## 2021-06-24 LAB — LIPID PANEL
Chol/HDL Ratio: 1.3 ratio (ref 0.0–5.0)
Cholesterol, Total: 96 mg/dL — ABNORMAL LOW (ref 100–199)
HDL: 74 mg/dL (ref >39)
LDL Chol Calc (NIH): 9 mg/dL (ref 0–99)
Triglycerides: 51 mg/dL (ref 0–149)
VLDL Cholesterol Cal: 13 mg/dL (ref 5–40)

## 2021-06-24 LAB — ALT: ALT: 36 IU/L (ref 0–44)

## 2021-12-04 ENCOUNTER — Telehealth: Payer: Self-pay

## 2021-12-04 MED ORDER — PRALUENT 75 MG/ML ~~LOC~~ SOAJ: 75.0000 mg | SUBCUTANEOUS | 11 refills | Status: DC

## 2021-12-04 NOTE — Telephone Encounter (Signed)
Called and spoke w/pt stated that repatha no longer covered by insurance so we switched to praluent and copay card provided to the pt. Pt voiced understanding

## 2021-12-04 NOTE — Telephone Encounter (Signed)
Yes, call pt to advise of Praluent approval/provide him with copay card info and that insurance is requiring the change from [place]. Will need Praluent rx sent in and Repatha rx removed from med list, thanks!

## 2021-12-04 NOTE — Telephone Encounter (Signed)
Routing to megan supple to make sure that its ok I call the pt to notify him. Pt changed insurances so I submitted a pa for praluent 75mg  q2w as verbally instructed by rph supple.  Isaac Summers (Key: [id number]) Dear Isaac Summers: Were pleased to let you know that weve approved your or your doctors request for coverage (sometimes called a prior authorization) for Praluent 75MG /ML [place] SOAJ. You can now fill your prescription, and it will be covered according to your plan. As long as you remain covered by your prescription drug plan and there are no changes to your plan benefits, this request is approved from 12/04/2021 to 12/04/2022. Copay card is as follows:

## 2022-05-02 ENCOUNTER — Telehealth: Payer: Self-pay | Admitting: Pharmacist

## 2022-05-02 NOTE — Telephone Encounter (Signed)
Received fax for Praluent PA renewal. This has been submitted and approved through 05/03/23.

## 2022-05-29 MED ORDER — REPATHA SURECLICK 140 MG/ML ~~LOC~~ SOAJ: 1.0000 | SUBCUTANEOUS | 11 refills | Status: DC

## 2022-05-29 NOTE — Addendum Note (Signed)
Addended by: Eutimio Gharibian E on: 05/29/2022 03:53 PM   Modules accepted: Orders

## 2022-05-29 NOTE — Telephone Encounter (Addendum)
Received fax from pharmacy that pt's insurance is now mandating he switch back from Praluent to [place] when they JUST made him change from [place] to [place] in January. Unsure how their formulary is changing twice in 6 months. Repatha available without PA, new rx sent to pharmacy. Detailed message left for pt to go back to taking Repatha and to make sure the pharmacy links his old copay card with the new rx.

## 2022-05-30 ENCOUNTER — Other Ambulatory Visit: Payer: Self-pay | Admitting: *Deleted

## 2022-05-30 MED ORDER — ROSUVASTATIN CALCIUM 40 MG PO TABS: 40.0000 mg | ORAL_TABLET | Freq: Every day | ORAL | 0 refills | Status: DC

## 2022-06-25 ENCOUNTER — Other Ambulatory Visit: Payer: Self-pay | Admitting: *Deleted

## 2022-06-25 MED ORDER — ROSUVASTATIN CALCIUM 40 MG PO TABS: 40.0000 mg | ORAL_TABLET | Freq: Every day | ORAL | 0 refills | Status: DC

## 2022-07-21 ENCOUNTER — Other Ambulatory Visit: Payer: Self-pay

## 2022-07-21 MED ORDER — ROSUVASTATIN CALCIUM 40 MG PO TABS: 40.0000 mg | ORAL_TABLET | Freq: Every day | ORAL | 0 refills | Status: DC

## 2022-08-21 ENCOUNTER — Encounter: Payer: Self-pay | Admitting: Cardiology

## 2022-08-21 DIAGNOSIS — I25119 Atherosclerotic heart disease of native coronary artery with unspecified angina pectoris: Secondary | ICD-10-CM

## 2022-08-21 DIAGNOSIS — I2510 Atherosclerotic heart disease of native coronary artery without angina pectoris: Secondary | ICD-10-CM

## 2022-08-21 DIAGNOSIS — Z79899 Other long term (current) drug therapy: Secondary | ICD-10-CM

## 2022-08-21 DIAGNOSIS — E7889 Other lipoprotein metabolism disorders: Secondary | ICD-10-CM

## 2022-08-21 DIAGNOSIS — E785 Hyperlipidemia, unspecified: Secondary | ICD-10-CM

## 2022-08-21 NOTE — Telephone Encounter (Signed)
Lipids and LFTs are ordered and pt is scheduled to have done on 09/17/22.  He is aware of appt and to come fasting.   Pt agreed to this plan.

## 2022-09-08 ENCOUNTER — Other Ambulatory Visit (HOSPITAL_BASED_OUTPATIENT_CLINIC_OR_DEPARTMENT_OTHER): Payer: Self-pay

## 2022-09-08 MED ORDER — COMIRNATY 30 MCG/0.3ML IM SUSY
PREFILLED_SYRINGE | INTRAMUSCULAR | 0 refills | Status: DC
  Filled 2022-09-08: qty 0.3, 1d supply, fill #0

## 2022-09-08 MED ORDER — INFLUENZA VAC SPLIT QUAD 0.5 ML IM SUSY
PREFILLED_SYRINGE | INTRAMUSCULAR | 0 refills | Status: DC
  Filled 2022-09-08: qty 0.5, 1d supply, fill #0

## 2022-09-17 ENCOUNTER — Ambulatory Visit: Payer: 59 | Attending: Cardiology

## 2022-09-17 DIAGNOSIS — E785 Hyperlipidemia, unspecified: Secondary | ICD-10-CM

## 2022-09-17 DIAGNOSIS — Z79899 Other long term (current) drug therapy: Secondary | ICD-10-CM

## 2022-09-17 DIAGNOSIS — I25119 Atherosclerotic heart disease of native coronary artery with unspecified angina pectoris: Secondary | ICD-10-CM

## 2022-09-17 DIAGNOSIS — E7889 Other lipoprotein metabolism disorders: Secondary | ICD-10-CM

## 2022-09-17 DIAGNOSIS — I2510 Atherosclerotic heart disease of native coronary artery without angina pectoris: Secondary | ICD-10-CM

## 2022-09-17 LAB — HEPATIC FUNCTION PANEL
ALT: 35 IU/L (ref 0–44)
AST: 30 IU/L (ref 0–40)
Albumin: 4.7 g/dL (ref 3.8–4.9)
Alkaline Phosphatase: 61 IU/L (ref 44–121)
Bilirubin Total: 0.5 mg/dL (ref 0.0–1.2)
Bilirubin, Direct: 0.17 mg/dL (ref 0.00–0.40)
Total Protein: 6.7 g/dL (ref 6.0–8.5)

## 2022-09-17 LAB — LIPID PANEL
Chol/HDL Ratio: 1.6 ratio (ref 0.0–5.0)
Cholesterol, Total: 109 mg/dL (ref 100–199)
HDL: 67 mg/dL (ref >39)
LDL Chol Calc (NIH): 23 mg/dL (ref 0–99)
Triglycerides: 108 mg/dL (ref 0–149)
VLDL Cholesterol Cal: 19 mg/dL (ref 5–40)

## 2022-09-21 NOTE — Progress Notes (Unsigned)
Cardiology Office Note:    Date:  09/23/2022   ID:  Isaac Summers, DOB [birth year], MRN [id number]  PCP:  Chesley Noon, MD   [place]  Cardiologist:  None  Advanced Practice Provider:  No care team member to display Electrophysiologist:  None   Referring MD: Chesley Noon, MD    History of Present Illness:    Isaac Summers is a 51 y.o. male with a hx of coronary artery Ca, HTN, HLD, GERD and asthma who presents to clinic for follow-up.  Seen by Dr. Burt Knack in 2018 for abnormal ECG and SOB. Myoview at that time negative for ischemia.  Saw Dr. Melford Aase 12/10/20 for DOE. He was recommended for coronary calcium score which demonstrated Ca score 231, LM 0, LAD 97, Cx 20, RCA 114 prompting referral to Cardiology.  Was seen in clinic by me in 01/22/21 where he was complaining of DOE when walking inclines. Was able to tolerate cycling and HITT classes without issues. LDL was 158. We trialed switching atorvastatin to crestor but LDL remained above goal <70. We therefore started him on repatha at that time. He then called due to an episode of chest pain while at his [place]. Myoview 04/25/21 with normal LVEF, no perfusion abnormalities.   Was last seen in clinic on 04/2021 where he was doing well. Remained very active.   Today, the patient overall feels very well. He remains very active with no exertional chest pain or SOB. Blood pressure is well controlled at home (did not take meds today). Lipids are very well controlled and has occasional muscle aches. No orthopnea, PND, lightheadedness or dizziness.  Past Medical History:  Diagnosis Date   Allergic rhinitis    Arthritis    GERD (gastroesophageal reflux disease)    Gout    per pt as of 07-10-2017-- stable   History of kidney stones    History of malignant melanoma of skin 2007   2007  left chest wall s/p  WLE/  2008 left shoulder area   Hyperlipidemia    Hypertension    Left ureteral  stone    Mild intermittent asthma    Urgency of urination    Wears glasses     Past Surgical History:  Procedure Laterality Date   CYSTOSCOPY W/ RETROGRADES Left 07/16/2017   Procedure: CYSTOSCOPY WITH RETROGRADE PYELOGRAM;  Surgeon: Cleon Gustin, MD;  Location: [facility];  Service: Urology;  Laterality: Left;   CYSTOSCOPY W/ URETERAL STENT PLACEMENT Left 06/19/2017   Procedure: CYSTOSCOPY WITH RETROGRADE PYELOGRAM/URETERAL STENT PLACEMENT;  Surgeon: Cleon Gustin, MD;  Location: [facility];  Service: Urology;  Laterality: Left;   CYSTOSCOPY/URETEROSCOPY/HOLMIUM LASER/STENT PLACEMENT Left 07/16/2017   Procedure: CYSTOSCOPY/URETEROSCOPY/HOLMIUM LASER/STENT EXCHANGE;  Surgeon: Cleon Gustin, MD;  Location: [facility];  Service: Urology;  Laterality: Left;  NEEDS TOTAL 30 MIN   KNEE ARTHROSCOPY Right 2013 approx.   LEFT SHOULDER RECONSTRUCTION  1992   WIDE LOCAL EXCISION LEFT CHEST WALL  06/26/2006   malignant melanoma    Current Medications: Current Meds  Medication Sig   acetaminophen (TYLENOL) 500 MG tablet Take 1,000 mg by mouth every 6 (six) hours as needed for mild pain or moderate pain.   aspirin EC 81 MG tablet Take 81 mg by mouth daily.   celecoxib (CELEBREX) 200 MG capsule Take 200 mg by mouth daily as needed for pain.   colchicine 0.6 MG tablet Take 0.6 mg by mouth 2 (  two) times daily as needed (gout).    COVID-19 mRNA vaccine 2023-2024 (COMIRNATY) syringe Inject into the muscle.   Dexlansoprazole 30 MG capsule Take 30 mg by mouth daily.   EPINEPHrine 0.3 mg/0.3 mL IJ SOAJ injection Inject 0.3 mg into the muscle as needed for anaphylaxis.   esomeprazole (NEXIUM) 20 MG capsule Take 20 mg by mouth as needed.   Evolocumab (REPATHA SURECLICK) 510 MG/ML SOAJ Inject 1 Pen into the skin every 14 (fourteen) days.   febuxostat (ULORIC) 40 MG tablet Take 1 tablet by mouth daily.   fluticasone (FLONASE) 50 MCG/ACT nasal spray Place 2 sprays  into both nostrils 2 (two) times daily as needed for allergies or rhinitis.   ibuprofen (ADVIL,MOTRIN) 200 MG tablet Take 400 mg by mouth every 6 (six) hours as needed for mild pain or moderate pain.   influenza vac split quadrivalent PF (FLUARIX) 0.5 ML injection Inject into the muscle.   olmesartan (BENICAR) 40 MG tablet Take 40 mg by mouth every morning.    PROAIR HFA 108 (90 Base) MCG/ACT inhaler Take 2 puffs by mouth every 6 (six) hours as needed for wheezing or shortness of breath.   rosuvastatin (CRESTOR) 20 MG tablet Take 1 tablet (20 mg total) by mouth daily.   sertraline (ZOLOFT) 50 MG tablet Take 50 mg by mouth every morning.    [DISCONTINUED] rosuvastatin (CRESTOR) 40 MG tablet Take 1 tablet (40 mg total) by mouth daily.     Allergies:   Other   Social History   Socioeconomic History   Marital status: Married    Spouse name: Not on file   Number of children: Not on file   Years of education: Not on file   Highest education level: Not on file  Occupational History   Not on file  Tobacco Use   Smoking status: Never   Smokeless tobacco: Never  Vaping Use   Vaping Use: Never used  Substance and Sexual Activity   Alcohol use: Yes    Alcohol/week: 3.0 - 4.0 standard drinks of alcohol    Types: 3 - 4 Shots of liquor per week   Drug use: No   Sexual activity: Not on file    Comment: 2006--- vasectomy (office)  Other Topics Concern   Not on file  Social History Narrative   Not on file   Social Determinants of Health   Financial Resource Strain: Not on file  Food Insecurity: Not on file  Transportation Needs: Not on file  Physical Activity: Not on file  Stress: Not on file  Social Connections: Not on file     Family History: The patient's family history includes CAD in his father; CVA in his mother; Hypertension in his brother and mother.  ROS:   Please see the history of present illness.    Review of Systems  Constitutional:  Negative for chills and fever.   HENT:  Negative for hearing loss.   Eyes:  Negative for blurred vision.  Respiratory:  Negative for shortness of breath.   Cardiovascular:  Negative for chest pain, palpitations, orthopnea, claudication, leg swelling and PND.  Gastrointestinal:  Negative for nausea and vomiting.  Genitourinary:  Negative for dysuria.  Musculoskeletal:  Negative for falls and myalgias.  Neurological:  Negative for dizziness and loss of consciousness.  Psychiatric/Behavioral:  Negative for substance abuse.     EKGs/Labs/Other Studies Reviewed:    The following studies were reviewed today: Myoview 04/25/21:  Nuclear stress EF: 56%. The left ventricular ejection fraction  is normal (55-65%). Blood pressure demonstrated a normal response to exercise. There was no ST segment deviation noted during stress. The study is normal. This is a low risk study.   Exercise time: 9 min Workload: 10.1 METS, average exercise capacity Test stopped due to: fatigue BP response: elevated without true hypertensive response to exercise HR response: normal Rhythm: SR   No ischemia on treadmill ECG. No ischemia or infarction on perfusion images. Normal wall motion. Low risk study.  Coronary Calcium Score 12/17/20: FINDINGS:   Calcium score: 231.  LM: 0.  LAD: 97.  Cx: 20.  RCA: 114   Heart size is Normal  Visible lung fields are: clear  Lymph nodes: Not enlarged  Abdomen: Fatty liver    Myoview 2018: Nuclear stress EF: 63%. There was no ST segment deviation noted during stress. Blood pressure demonstrated a normal response to exercise. The study is normal. The left ventricular ejection fraction is normal (55-65%).   1. EF 63% with normal wall motion.  2. Normal perfusion images, no evidence for ischemia/infarction.    Normal study.    EKG:  09/23/22: NSR with HR 65  Recent Labs: 09/17/2022: ALT 35  Recent Lipid Panel    Component Value Date/Time   CHOL 109 09/17/2022 0845   TRIG 108 09/17/2022  0845   HDL 67 09/17/2022 0845   CHOLHDL 1.6 09/17/2022 0845   LDLCALC 23 09/17/2022 0845   LDLDIRECT 162 (H) 03/07/2021 0842    Physical Exam:    VS:  BP (!) 142/84   Pulse 65   Ht '5\' 8"'$  (1.727 m)   Wt 226 lb (102.5 kg)   SpO2 95%   BMI 34.36 kg/m     Wt Readings from Last 3 Encounters:  09/23/22 226 lb (102.5 kg)  05/06/21 223 lb 9.6 oz (101.4 kg)  04/25/21 226 lb (102.5 kg)     GEN:  Comfortable, NAD HEENT: Normal NECK: No JVD; No carotid bruits CARDIAC: RRR, no murmurs RESPIRATORY:  Clear to auscultation without rales, wheezing or rhonchi  ABDOMEN: Soft, non-tender, non-distended MUSCULOSKELETAL:  No edema; No deformity  SKIN: Warm and dry NEUROLOGIC:  Alert and oriented x 3 PSYCHIATRIC:  Normal affect   ASSESSMENT:    1. Coronary artery disease involving native coronary artery of native heart without angina pectoris   2. Hyperlipidemia, unspecified hyperlipidemia type   3. Coronary artery disease involving native coronary artery of native heart with angina pectoris ([place])   4. Primary hypertension   5. Dyspnea on exertion     PLAN:    In order of problems listed above:  #Coronary artery calcification: Ca score noted to be 231. 97 LAD, 114 RCA. Myoview obtained due to SOB while mowing the lawn and walking an incline which showed normal exercise capacity and no perfusion defects. Currently doing well  -Myoview negative for ischemia/infarction -Continue repatha 140 q 2weeks -Decrease crestor to '20mg'$  daily -Continue ASA '81mg'$  daily -Continue exercise and healthy diet  #HLD: LDL very well controlled at 9. Can cut back on crestor '20mg'$  daily -Continue repatha 140 q 2weeks --Decrease crestor to '20mg'$  daily -Goal LDL<70  #HTN:  Well controlled at home <130/90. -Continue olmesartan '40mg'$  daily  #Suspected orthostasis: Improved. Patient with episodes of lightheadedness after mowing the lawn and rapid change in positions. Admits to poor fluid intake. Suspect  orthostasis like symptoms and recommended increasing fluid intake as well as added electrolytes such as power aide zero or gatorade zero with exercise. -Ischemic work-up reassuring -Continue  fluid intake   Medication Adjustments/Labs and Tests Ordered: Current medicines are reviewed at length with the patient today.  Concerns regarding medicines are outlined above.  Orders Placed This Encounter  Procedures   EKG 12-Lead   Meds ordered this encounter  Medications   rosuvastatin (CRESTOR) 20 MG tablet    Sig: Take 1 tablet (20 mg total) by mouth daily.    Dispense:  90 tablet    Refill:  3    DOSE DECREASE    Patient Instructions  Medication Instructions:   DECREASE YOUR ROSUVASTATIN (CRESTOR) TO 20 MG BY MOUTH DAILY  *If you need a refill on your cardiac medications before your next appointment, please call your pharmacy*    Follow-Up: At [facility], you and your health needs are our priority.  As part of our continuing mission to provide you with exceptional heart care, we have created designated Provider Care Teams.  These Care Teams include your primary Cardiologist (physician) and Advanced Practice Providers (APPs -  Physician Assistants and Nurse Practitioners) who all work together to provide you with the care you need, when you need it.  We recommend signing up for the patient portal called "MyChart".  Sign up information is provided on this After Visit Summary.  MyChart is used to connect with patients for Virtual Visits (Telemedicine).  Patients are able to view lab/test results, encounter notes, upcoming appointments, etc.  Non-urgent messages can be sent to your provider as well.   To learn more about what you can do with MyChart, go to [email or web address].    Your next appointment:   1 year(s)  The format for your next appointment:   In Person  Provider:   DR. Johney Frame   Important Information About Sugar         Signed, Freada Bergeron, MD  09/23/2022 4:49 PM    [place]

## 2022-09-23 ENCOUNTER — Encounter: Payer: Self-pay | Admitting: Cardiology

## 2022-09-23 ENCOUNTER — Ambulatory Visit: Payer: 59 | Attending: Cardiology | Admitting: Cardiology

## 2022-09-23 VITALS — BP 142/84 | HR 65 | Ht 68.0 in | Wt 226.0 lb

## 2022-09-23 DIAGNOSIS — E785 Hyperlipidemia, unspecified: Secondary | ICD-10-CM | POA: Diagnosis not present

## 2022-09-23 DIAGNOSIS — I25119 Atherosclerotic heart disease of native coronary artery with unspecified angina pectoris: Secondary | ICD-10-CM | POA: Diagnosis not present

## 2022-09-23 DIAGNOSIS — I10 Essential (primary) hypertension: Secondary | ICD-10-CM | POA: Diagnosis not present

## 2022-09-23 DIAGNOSIS — I2510 Atherosclerotic heart disease of native coronary artery without angina pectoris: Secondary | ICD-10-CM

## 2022-09-23 DIAGNOSIS — R0609 Other forms of dyspnea: Secondary | ICD-10-CM

## 2022-09-23 MED ORDER — ROSUVASTATIN CALCIUM 20 MG PO TABS: 20.0000 mg | ORAL_TABLET | Freq: Every day | ORAL | 3 refills | Status: DC

## 2022-09-23 NOTE — Patient Instructions (Signed)
Medication Instructions:   DECREASE YOUR ROSUVASTATIN (CRESTOR) TO 20 MG BY MOUTH DAILY  *If you need a refill on your cardiac medications before your next appointment, please call your pharmacy*    Follow-Up: At [facility], you and your health needs are our priority.  As part of our continuing mission to provide you with exceptional heart care, we have created designated Provider Care Teams.  These Care Teams include your primary Cardiologist (physician) and Advanced Practice Providers (APPs -  Physician Assistants and Nurse Practitioners) who all work together to provide you with the care you need, when you need it.  We recommend signing up for the patient portal called "MyChart".  Sign up information is provided on this After Visit Summary.  MyChart is used to connect with patients for Virtual Visits (Telemedicine).  Patients are able to view lab/test results, encounter notes, upcoming appointments, etc.  Non-urgent messages can be sent to your provider as well.   To learn more about what you can do with MyChart, go to [email or web address].    Your next appointment:   1 year(s)  The format for your next appointment:   In Person  Provider:   DR. Johney Frame   Important Information About Sugar

## 2023-01-29 ENCOUNTER — Ambulatory Visit
Admission: RE | Admit: 2023-01-29 | Discharge: 2023-01-29 | Disposition: A | Discharge status: Home / Self Care | Payer: 59 | Source: Ambulatory Visit | Attending: Family Medicine | Admitting: Family Medicine

## 2023-01-29 ENCOUNTER — Other Ambulatory Visit: Payer: Self-pay | Admitting: Family Medicine

## 2023-01-29 DIAGNOSIS — J45909 Unspecified asthma, uncomplicated: Secondary | ICD-10-CM

## 2023-01-29 DIAGNOSIS — R059 Cough, unspecified: Secondary | ICD-10-CM

## 2023-05-04 ENCOUNTER — Other Ambulatory Visit: Payer: Self-pay | Admitting: Family Medicine

## 2023-05-04 ENCOUNTER — Ambulatory Visit
Admission: RE | Admit: 2023-05-04 | Discharge: 2023-05-04 | Disposition: A | Discharge status: Home / Self Care | Payer: 59 | Source: Ambulatory Visit | Attending: Family Medicine | Admitting: Family Medicine

## 2023-05-04 DIAGNOSIS — R058 Other specified cough: Secondary | ICD-10-CM

## 2023-05-04 DIAGNOSIS — J189 Pneumonia, unspecified organism: Secondary | ICD-10-CM

## 2023-05-11 ENCOUNTER — Other Ambulatory Visit: Payer: Self-pay | Admitting: Family Medicine

## 2023-05-11 DIAGNOSIS — R059 Cough, unspecified: Secondary | ICD-10-CM

## 2023-05-18 ENCOUNTER — Encounter: Payer: Self-pay | Admitting: Family Medicine

## 2023-05-22 ENCOUNTER — Other Ambulatory Visit: Payer: Self-pay | Admitting: Pharmacist Clinician (PhC)/ Clinical Pharmacy Specialist

## 2023-05-22 MED ORDER — REPATHA SURECLICK 140 MG/ML ~~LOC~~ SOAJ: 140.0000 mg | SUBCUTANEOUS | 3 refills | Status: DC

## 2023-06-02 ENCOUNTER — Encounter: Payer: Self-pay | Admitting: Cardiology

## 2023-06-02 ENCOUNTER — Other Ambulatory Visit (HOSPITAL_COMMUNITY): Payer: Self-pay

## 2023-06-02 ENCOUNTER — Telehealth: Payer: Self-pay

## 2023-06-02 NOTE — Telephone Encounter (Addendum)
Pharmacy Patient Advocate Encounter   Received notification from Physician's Office/PHARMD-[facility]LE that prior authorization for REPATHA is required/requested.   Insurance verification completed.   The patient is insured through [employer] .   Per test claim: PA submitted to [employer] via CoverMyMeds Key/confirmation #/EOC [id number]                 Status is pending   Prior Auth was faxed via cmm

## 2023-06-03 ENCOUNTER — Other Ambulatory Visit (HOSPITAL_COMMUNITY): Payer: Self-pay

## 2023-06-03 MED ORDER — REPATHA SURECLICK 140 MG/ML ~~LOC~~ SOAJ: 140.0000 mg | SUBCUTANEOUS | 3 refills | Status: DC

## 2023-06-03 NOTE — Telephone Encounter (Signed)
Per pts plan (NO P/A REQ'D AT THIS TIME) for Repatha

## 2023-06-03 NOTE — Telephone Encounter (Signed)
Pt notified of approval

## 2023-06-04 ENCOUNTER — Ambulatory Visit
Admission: RE | Admit: 2023-06-04 | Discharge: 2023-06-04 | Disposition: A | Discharge status: Home / Self Care | Payer: 59 | Source: Ambulatory Visit | Attending: Family Medicine | Admitting: Family Medicine

## 2023-06-04 DIAGNOSIS — R059 Cough, unspecified: Secondary | ICD-10-CM

## 2023-06-11 ENCOUNTER — Other Ambulatory Visit (HOSPITAL_COMMUNITY): Payer: Self-pay

## 2023-06-11 ENCOUNTER — Telehealth: Payer: Self-pay

## 2023-06-11 NOTE — Telephone Encounter (Signed)
(   See previous chartnotes from 7/23)  A new P/a has been reinitiated for Repatha.  Key: [id number]   Faxed via CMM

## 2023-06-12 ENCOUNTER — Other Ambulatory Visit: Payer: Self-pay | Admitting: Pharmacist

## 2023-06-12 MED ORDER — REPATHA SURECLICK 140 MG/ML ~~LOC~~ SOAJ: 140.0000 mg | SUBCUTANEOUS | 3 refills | Status: DC

## 2023-06-12 NOTE — Telephone Encounter (Signed)
P/A has been approved from 8.1.24 TO 8.1.25   (See approval letter in the patients chart)

## 2023-06-12 NOTE — Telephone Encounter (Signed)
Pt.notified

## 2023-07-31 ENCOUNTER — Encounter: Payer: Self-pay | Admitting: Internal Medicine

## 2023-08-20 ENCOUNTER — Encounter: Payer: Self-pay | Admitting: Internal Medicine

## 2023-09-18 ENCOUNTER — Encounter: Payer: Self-pay | Admitting: Internal Medicine

## 2023-09-24 ENCOUNTER — Encounter: Payer: Self-pay | Admitting: Internal Medicine

## 2023-09-24 ENCOUNTER — Ambulatory Visit: Payer: 59 | Attending: Internal Medicine | Admitting: Internal Medicine

## 2023-09-24 VITALS — BP 132/76 | HR 78 | Ht 68.0 in | Wt 222.0 lb

## 2023-09-24 DIAGNOSIS — E785 Hyperlipidemia, unspecified: Secondary | ICD-10-CM | POA: Diagnosis not present

## 2023-09-24 DIAGNOSIS — I2510 Atherosclerotic heart disease of native coronary artery without angina pectoris: Secondary | ICD-10-CM | POA: Diagnosis not present

## 2023-09-24 DIAGNOSIS — I10 Essential (primary) hypertension: Secondary | ICD-10-CM

## 2023-09-24 NOTE — Progress Notes (Signed)
Cardiology Office Note:    Date:  09/24/2023   ID:  Isaac Summers, DOB March 04, 1971, MRN [id number]  PCP:  Isaac Mesi, MD   [facility]  Cardiologist:  None  Advanced Practice Provider:  No care team member to display Electrophysiologist:  None   Referring MD: Isaac Inch, MD   CC: Transition to new cardiologist  History of Present Illness:    Isaac Summers is a 52 y.o. male with a hx of CAC, HTN, HLD, GERD and asthma who presents transition to a new cardiologist 2018: Seen by Dr. Excell Summers with negative work up. 2022: + Calcium score, established with Dr. Shari Summers  Isaac Summers, a 52 year old individual with a history of coronary artery calcifications, hypertension, and hyperlipidemia, presents for a routine cardiology follow-up. He was previously seen by Dr. Excell Summers and Dr. Shari Summers, with a focus on aggressive prevention due to the presence of coronary artery calcifications, which were more than expected for his age. A positive calcium score was noted, and an LDL goal of under 70 was established. He underwent a stress test in 2018, which was unremarkable, and in 2022, he achieved greater than 10 functional METs with a normal stress test, conferring a mortality benefit.  He has been on Repatha for cholesterol management, which has been effective, and olmesartan for blood pressure control. He also takes aspirin for secondary prevention. He reports no chest pain or breathing issues and maintains an active lifestyle, including walking, cycling, and Pilates. 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  He has experienced some positional changes in the past, potentially related to orthostatic hypotension. However, he has been managing this with core engagement exercises, including Pilates. He has been advised to monitor for lightheadedness, particularly during exercise, as this may indicate a need for adjustment in his blood pressure  medication.  Past Medical History:  Diagnosis Date   Allergic rhinitis    Arthritis    GERD (gastroesophageal reflux disease)    Gout    per pt as of 07-10-2017-- stable   History of kidney stones    History of malignant melanoma of skin 2007   2007  left chest wall s/p  [facility]/  2008 left shoulder area   Hyperlipidemia    Hypertension    Left ureteral stone    Mild intermittent asthma    Urgency of urination    Wears glasses     Past Surgical History:  Procedure Laterality Date   CYSTOSCOPY W/ RETROGRADES Left 07/16/2017   Procedure: CYSTOSCOPY WITH RETROGRADE PYELOGRAM;  Surgeon: Malen Gauze, MD;  Location: [facility];  Service: Urology;  Laterality: Left;   CYSTOSCOPY W/ URETERAL STENT PLACEMENT Left 06/19/2017   Procedure: CYSTOSCOPY WITH RETROGRADE PYELOGRAM/URETERAL STENT PLACEMENT;  Surgeon: Malen Gauze, MD;  Location: [facility];  Service: Urology;  Laterality: Left;   CYSTOSCOPY/URETEROSCOPY/HOLMIUM LASER/STENT PLACEMENT Left 07/16/2017   Procedure: CYSTOSCOPY/URETEROSCOPY/HOLMIUM LASER/STENT EXCHANGE;  Surgeon: Malen Gauze, MD;  Location: [facility];  Service: Urology;  Laterality: Left;  NEEDS TOTAL 30 MIN   KNEE ARTHROSCOPY Right 2013 approx.   LEFT SHOULDER RECONSTRUCTION  1992   WIDE LOCAL EXCISION LEFT CHEST WALL  06/26/2006   malignant melanoma    Current Medications: Current Meds  Medication Sig   acetaminophen (TYLENOL) 500 MG tablet Take 1,000 mg by mouth every 6 (six) hours as needed for mild pain or moderate pain.   aspirin EC 81 MG tablet Take 81 mg  by mouth daily.   budesonide (PULMICORT) 0.5 MG/2ML nebulizer solution as needed (shortness of breath).   celecoxib (CELEBREX) 200 MG capsule Take 200 mg by mouth daily as needed for pain.   colchicine 0.6 MG tablet Take 0.6 mg by mouth 2 (two) times daily as needed (gout).    Evolocumab (REPATHA SURECLICK) 140 MG/ML SOAJ Inject 140 mg into the skin every 14  (fourteen) days.   febuxostat (ULORIC) 40 MG tablet Take 1 tablet by mouth daily.   fluticasone (FLONASE) 50 MCG/ACT nasal spray Place 2 sprays into both nostrils 2 (two) times daily as needed for allergies or rhinitis.   ibuprofen (ADVIL,MOTRIN) 200 MG tablet Take 400 mg by mouth every 6 (six) hours as needed for mild pain or moderate pain.   olmesartan (BENICAR) 40 MG tablet Take 40 mg by mouth every morning.    omeprazole (PRILOSEC OTC) 20 MG tablet Take 20 mg by mouth daily as needed.   PROAIR HFA 108 (90 Base) MCG/ACT inhaler Take 2 puffs by mouth every 6 (six) hours as needed for wheezing or shortness of breath.   rosuvastatin (CRESTOR) 20 MG tablet Take 1 tablet (20 mg total) by mouth daily.   sertraline (ZOLOFT) 50 MG tablet Take 50 mg by mouth every morning.      Allergies:   Other   Social History   Socioeconomic History   Marital status: Married    Spouse name: Not on file   Number of children: Not on file   Years of education: Not on file   Highest education level: Not on file  Occupational History   Not on file  Tobacco Use   Smoking status: Never   Smokeless tobacco: Never  Vaping Use   Vaping status: Never Used  Substance and Sexual Activity   Alcohol use: Yes    Alcohol/week: 3.0 - 4.0 standard drinks of alcohol    Types: 3 - 4 Shots of liquor per week   Drug use: No   Sexual activity: Not on file    Comment: 2006--- vasectomy (office)  Other Topics Concern   Not on file  Social History Narrative   Not on file   Social Determinants of Health   Financial Resource Strain: Low Risk  (07/30/2021)   Received from [facility], [facility]   Overall Financial Resource Strain (CARDIA)    Difficulty of Paying Living Expenses: Not hard at all  Food Insecurity: No Food Insecurity (07/30/2021)   Received from [facility], [facility]   Hunger Vital Sign    Worried About Running Out of Food in the Last Year: Never true    Ran Out of Food in the Last Year:  Never true  Transportation Needs: No Transportation Needs (07/30/2021)   Received from [facility], [facility]   PRAPARE - Transportation    Lack of Transportation (Medical): No    Lack of Transportation (Non-Medical): No  Physical Activity: Sufficiently Active (07/30/2021)   Received from [facility], [facility]   Exercise Vital Sign    Days of Exercise per Week: 5 days    Minutes of Exercise per Session: 40 min  Stress: No Stress Concern Present (07/30/2021)   Received from [facility], [facility] Occupational [facility] - Occupational Stress Questionnaire    Feeling of Stress : Only a little  Social Connections: Unknown (03/24/2022)   Received from [facility], [facility]   Social Network    Social Network: Not on file  Family History: The patient's family history includes CAD in his father; CVA in his mother; Hypertension in his brother and mother.  ROS:   Please see the history of present illness.      EKGs/Labs/Other Studies Reviewed:    Cardiac Studies & Procedures     STRESS TESTS  MYOCARDIAL PERFUSION IMAGING 04/25/2021  Narrative  Nuclear stress EF: 56%.  The left ventricular ejection fraction is normal (55-65%).  Blood pressure demonstrated a normal response to exercise.  There was no ST segment deviation noted during stress.  The study is normal.  This is a low risk study.  Exercise time: 9 min Workload: 10.1 METS, average exercise capacity Test stopped due to: fatigue BP response: elevated without true hypertensive response to exercise HR response: normal Rhythm: SR  No ischemia on treadmill ECG. No ischemia or infarction on perfusion images. Normal wall motion. Low risk study.              Recent Labs: No results found for requested labs within last 365 days.  Recent Lipid Panel    Component Value Date/Time   CHOL 109 09/17/2022 0845   TRIG 108 09/17/2022 0845   HDL 67 09/17/2022 0845   CHOLHDL 1.6  09/17/2022 0845   LDLCALC 23 09/17/2022 0845   LDLDIRECT 162 (H) 03/07/2021 0842    Physical Exam:    VS:  BP 132/76 (BP Location: Left Arm, Patient Position: Sitting)   Pulse 78   Ht 5\' 8"  (1.727 m)   Wt 222 lb (100.7 kg)   SpO2 94%   BMI 33.75 kg/m     Wt Readings from Last 3 Encounters:  09/24/23 222 lb (100.7 kg)  09/23/22 226 lb (102.5 kg)  05/06/21 223 lb 9.6 oz (101.4 kg)    GEN: NAD NECK: Bilateral Frank's Sign CARDIAC: RRR, no murmurs RESPIRATORY:  Clear to auscultation without rales, wheezing or rhonchi  ABDOMEN: Soft, non-tender, non-distended MUSCULOSKELETAL:  No edema; No deformity  SKIN: Warm and dry NEUROLOGIC:  Alert and oriented x 3 PSYCHIATRIC:  Normal affect   ASSESSMENT:    1. Coronary artery disease involving native coronary artery of native heart without angina pectoris   2. Hyperlipidemia, unspecified hyperlipidemia type   3. Primary hypertension      PLAN:     Coronary Artery Calcifications - No history of myocardial infarction. Previous stress tests in 2018 and 2022 were unremarkable, with the latter showing >10 functional METs and conferring a mortality benefit. Current EKG is consistent with previous stress test results. Asymptomatic with no chest pain or dyspnea. Aggressive prevention strategy in place due to age-related risk.  - Discussed maintaining cholesterol levels and the role of Repatha in achieving LDL goals <70. - Explained aspirin use for secondary prevention due to existing coronary artery calcifications, despite recent guidelines on primary prevention. - Recheck cholesterol levels today - Continue current therapy for cholesterol management   Hyperlipidemia Managed with Repatha. Previous cholesterol levels were satisfactory. Goal LDL is <70. Repatha has been effective in maintaining cholesterol levels. - Recheck cholesterol levels today - Continue current meds  Hypertension - Well-controlled with olmesartan. Blood pressure  readings are within normal limits.  Discussed monitoring for lightheadedness as increased physical activity may lower blood pressure further. - Continue olmesartan - Monitor blood pressure, especially if experiencing lightheadedness  Orthostatic Hypotension History - Potential risk, especially with positional changes. Engaging in Pilates and core exercises to mitigate risk. Discussed benefits of core engagement exercises in reducing orthostatic symptoms. - Continue  Pilates and core exercises, monitoring as above  One year with me unless new sx  Medication Adjustments/Labs and Tests Ordered: Current medicines are reviewed at length with the patient today.  Concerns regarding medicines are outlined above.  Orders Placed This Encounter  Procedures   Lipid panel   ALT   EKG 12-Lead   No orders of the defined types were placed in this encounter.   Patient Instructions  Medication Instructions:  Your physician recommends that you continue on your current medications as directed. Please refer to the Current Medication list given to you today.  *If you need a refill on your cardiac medications before your next appointment, please call your pharmacy*   Lab Work: FLP, ALT  If you have labs (blood work) drawn today and your tests are completely normal, you will receive your results only by: MyChart Message (if you have MyChart) OR A paper copy in the mail If you have any lab test that is abnormal or we need to change your treatment, we will call you to review the results.   Testing/Procedures: NONE   Follow-Up: At [facility], you and your health needs are our priority.  As part of our continuing mission to provide you with exceptional heart care, we have created designated Provider Care Teams.  These Care Teams include your primary Cardiologist (physician) and Advanced Practice Providers (APPs -  Physician Assistants and Nurse Practitioners) who all work together to provide  you with the care you need, when you need it.    Your next appointment:   1 year(s)  Provider:   Riley Lam, MD       Signed, Christell Constant, MD  09/24/2023 12:14 PM    [facility]

## 2023-09-24 NOTE — Patient Instructions (Signed)
Medication Instructions:  Your physician recommends that you continue on your current medications as directed. Please refer to the Current Medication list given to you today.  *If you need a refill on your cardiac medications before your next appointment, please call your pharmacy*   Lab Work: FLP, ALT If you have labs (blood work) drawn today and your tests are completely normal, you will receive your results only by: MyChart Message (if you have MyChart) OR A paper copy in the mail If you have any lab test that is abnormal or we need to change your treatment, we will call you to review the results.   Testing/Procedures: NONE   Follow-Up: At [facility], you and your health needs are our priority.  As part of our continuing mission to provide you with exceptional heart care, we have created designated Provider Care Teams.  These Care Teams include your primary Cardiologist (physician) and Advanced Practice Providers (APPs -  Physician Assistants and Nurse Practitioners) who all work together to provide you with the care you need, when you need it.    Your next appointment:   1 year(s)  Provider:   Riley Lam, MD

## 2023-09-25 LAB — LIPID PANEL
Chol/HDL Ratio: 1.7 ratio (ref 0.0–5.0)
Cholesterol, Total: 120 mg/dL (ref 100–199)
HDL: 71 mg/dL (ref >39)
LDL Chol Calc (NIH): 35 mg/dL (ref 0–99)
Triglycerides: 68 mg/dL (ref 0–149)
VLDL Cholesterol Cal: 14 mg/dL (ref 5–40)

## 2023-09-25 LAB — ALT: ALT: 38 [IU]/L (ref 0–44)

## 2023-09-28 NOTE — Telephone Encounter (Signed)
Ok to schedule in new patient appointment.  Katina Degree. Jimmey Ralph, MD 09/28/2023 12:48 PM

## 2023-10-02 NOTE — Telephone Encounter (Signed)
Please contact patient to schedule New Patient appt per Dr Lavone Neri approval

## 2023-10-12 ENCOUNTER — Other Ambulatory Visit (HOSPITAL_BASED_OUTPATIENT_CLINIC_OR_DEPARTMENT_OTHER): Payer: Self-pay

## 2023-10-12 MED ORDER — FLULAVAL 0.5 ML IM SUSY
0.5000 mL | PREFILLED_SYRINGE | Freq: Once | INTRAMUSCULAR | 0 refills | Status: AC
  Filled 2023-10-12: qty 0.5, 1d supply, fill #0

## 2023-10-12 MED ORDER — COMIRNATY 30 MCG/0.3ML IM SUSY
0.3000 mL | PREFILLED_SYRINGE | Freq: Once | INTRAMUSCULAR | 0 refills | Status: AC
  Filled 2023-10-12: qty 0.3, 1d supply, fill #0

## 2023-10-22 ENCOUNTER — Ambulatory Visit: Payer: 59 | Admitting: Family Medicine

## 2023-10-22 ENCOUNTER — Encounter: Payer: Self-pay | Admitting: Family Medicine

## 2023-10-22 VITALS — BP 122/80 | HR 74 | Temp 97.1°F | Ht 68.0 in | Wt 217.8 lb

## 2023-10-22 DIAGNOSIS — Z23 Encounter for immunization: Secondary | ICD-10-CM

## 2023-10-22 DIAGNOSIS — I2510 Atherosclerotic heart disease of native coronary artery without angina pectoris: Secondary | ICD-10-CM

## 2023-10-22 DIAGNOSIS — I10 Essential (primary) hypertension: Secondary | ICD-10-CM

## 2023-10-22 DIAGNOSIS — I2583 Coronary atherosclerosis due to lipid rich plaque: Secondary | ICD-10-CM

## 2023-10-22 DIAGNOSIS — J4520 Mild intermittent asthma, uncomplicated: Secondary | ICD-10-CM

## 2023-10-22 DIAGNOSIS — M1990 Unspecified osteoarthritis, unspecified site: Secondary | ICD-10-CM

## 2023-10-22 DIAGNOSIS — J45909 Unspecified asthma, uncomplicated: Secondary | ICD-10-CM | POA: Insufficient documentation

## 2023-10-22 DIAGNOSIS — E785 Hyperlipidemia, unspecified: Secondary | ICD-10-CM

## 2023-10-22 DIAGNOSIS — M109 Gout, unspecified: Secondary | ICD-10-CM

## 2023-10-22 DIAGNOSIS — K219 Gastro-esophageal reflux disease without esophagitis: Secondary | ICD-10-CM

## 2023-10-22 DIAGNOSIS — F411 Generalized anxiety disorder: Secondary | ICD-10-CM

## 2023-10-22 DIAGNOSIS — R7989 Other specified abnormal findings of blood chemistry: Secondary | ICD-10-CM | POA: Insufficient documentation

## 2023-10-22 NOTE — Assessment & Plan Note (Addendum)
Stable.  He is on Zoloft 50 mg daily and following with counselor.  Symptoms are currently manageable.  Will continue his current treatment regimen for now.

## 2023-10-22 NOTE — Assessment & Plan Note (Signed)
Controlled on Crestor 20 mg daily and Repatha 140 mg every 14 days.

## 2023-10-22 NOTE — Assessment & Plan Note (Signed)
Stable on omeprazole 20 mg daily as needed.

## 2023-10-22 NOTE — Assessment & Plan Note (Signed)
Following with orthopedics.  Uses NSAIDs as needed.  Recently had right thumb surgery.  Doing well postoperatively.

## 2023-10-22 NOTE — Patient Instructions (Addendum)
It was very nice to see you today!  Please come back to have your testosterone rechecked.   Please continue to work on diet and exercise.  We will get records regarding a colonoscopy.  I will see you back in a year for your next physical.  Come back sooner if needed.  Return in about 1 year (around 10/21/2024) for Annual Physical.   Take care, Dr Jimmey Ralph  PLEASE NOTE:  If you had any lab tests, please let us know if you have not heard back within a few days. You may see your results on mychart before we have a chance to review them but we will give you a call once they are reviewed by [place].   If we ordered any referrals today, please let us know if you have not heard from their office within the next week.   If you had any urgent prescriptions sent in today, please check with the pharmacy within an hour of our visit to make sure the prescription was transmitted appropriately.   Please try these tips to maintain a healthy lifestyle:  Eat at least 3 REAL meals and 1-2 snacks per day.  Aim for no more than 5 hours between eating.  If you eat breakfast, please do so within one hour of getting up.   Each meal should contain half fruits/vegetables, one quarter protein, and one quarter carbs (no bigger than a computer mouse)  Cut down on sweet beverages. This includes juice, soda, and sweet tea.   Drink at least 1 glass of water with each meal and aim for at least 8 glasses per day  Exercise at least 150 minutes every week.

## 2023-10-22 NOTE — Assessment & Plan Note (Signed)
He is not currently having any symptoms of low testosterone.  We did discuss risk and benefits of testosterone replacement.  Given his lack of symptoms, he is agreeable that replacement is probably not needed at this point however he would like to have a fasting level checked to get a good baseline.  Will place future order.

## 2023-10-22 NOTE — Progress Notes (Signed)
KEIWAN GIPP is a 52 y.o. male who presents today for an office visit. He is a new patient.   Assessment/Plan:  Chronic Problems Addressed Today: Generalized anxiety disorder Stable.  He is on Zoloft 50 mg daily and following with counselor.  Symptoms are currently manageable.  Will continue his current treatment regimen for now.  CAD (coronary artery disease) Following with cardiology for this.  He is on aspirin 81 mg daily, Crestor 20 mg daily, and Repatha 140 mg every 2 weeks.  Hyperlipidemia Controlled on Crestor 20 mg daily and Repatha 140 mg every 14 days.  HYPERTENSION Blood pressure at goal today on olmesartan 40 mg daily.  Osteoarthritis Following with orthopedics.  Uses NSAIDs as needed.  Recently had right thumb surgery.  Doing well postoperatively.  Asthma Overall stable.  No recent flares.  Uses albuterol as needed.  Does not need refill today.  We did discuss referral to pulmonology for further evaluation however given current mild symptoms he would like to hold off on this for now.  He will let us know if he has any significant change in symptoms.  Gout Stable.  No recent flares.  Continue Uloric 40 mg daily.  We can recheck uric acid level next blood draw.  GERD (gastroesophageal reflux disease) Stable on omeprazole 20 mg daily as needed.   Low testosterone He is not currently having any symptoms of low testosterone.  We did discuss risk and benefits of testosterone replacement.  Given his lack of symptoms, he is agreeable that replacement is probably not needed at this point however he would like to have a fasting level checked to get a good baseline.  Will place future order.  Preventative health care Had colonoscopy last year-will need to obtain these records.  Recently had labs - do not need to repeat.  Tetanus, Diphtheria, and Pertussis (Tdap) given today. Up-to-date on flu vaccine.    Subjective:  HPI:  See A/P for status of chronic conditions.   Patient is here today to establish care as a new patient.  His medical history is significant for coronary artery disease, primary hypertension, and dyslipidemia.  He does follow with cardiology for his CAD.  He is currently on Repatha 140 mg every 2 weeks, Crestor 20 mg daily, aspirin 81 mg daily.  His hypertension is controlled with olmesartan 40 mg daily.  He does have history of generalized anxiety disorder and is on Zoloft 50 mg daily and following with counselor.  Gout controlled with Oxistat 40 mg daily.  Uses colchicine as needed.  Does have a history of osteoarthritis and is following with orthopedics for this.  Uses Celebrex as needed.  He is on albuterol as needed for asthma.  This is mild and uncomplicated.  Usually has about 1 flareup per year.  He did have labs checked with PCP few months ago.  This showed low testosterone.  He is not sure if this was accurate as this was done in the mid afternoon.  The rest of his labs were normal.  Denies any symptoms of low testosterone.  No fatigue.  No lethargy.  No ED or decreased libido.  ROS: Per HPI, otherwise a complete review of systems was negative.   PMH:  The following were reviewed and entered/updated in epic: Past Medical History:  Diagnosis Date   Allergic rhinitis    Arthritis    GERD (gastroesophageal reflux disease)    Gout    per pt as of 07-10-2017-- stable  History of kidney stones    History of malignant melanoma of skin 2007   2007  left chest wall s/p  WLE/  2008 left shoulder area   Hyperlipidemia    Hypertension    Left ureteral stone    Mild intermittent asthma    Urgency of urination    Wears glasses    Patient Active Problem List   Diagnosis Date Noted   Generalized anxiety disorder 10/22/2023   CAD (coronary artery disease) 10/22/2023   Osteoarthritis 10/22/2023   Asthma 10/22/2023   Gout 10/22/2023   GERD (gastroesophageal reflux disease) 10/22/2023   Low testosterone 10/22/2023   Ureteral calculus  06/19/2017   Hyperlipidemia 11/06/2008   HYPERTENSION 11/06/2008   MELANOMA, HX OF 11/06/2008   Past Surgical History:  Procedure Laterality Date   CYSTOSCOPY W/ RETROGRADES Left 07/16/2017   Procedure: CYSTOSCOPY WITH RETROGRADE PYELOGRAM;  Surgeon: Malen Gauze, MD;  Location: [facility];  Service: Urology;  Laterality: Left;   CYSTOSCOPY W/ URETERAL STENT PLACEMENT Left 06/19/2017   Procedure: CYSTOSCOPY WITH RETROGRADE PYELOGRAM/URETERAL STENT PLACEMENT;  Surgeon: Malen Gauze, MD;  Location: [facility];  Service: Urology;  Laterality: Left;   CYSTOSCOPY/URETEROSCOPY/HOLMIUM LASER/STENT PLACEMENT Left 07/16/2017   Procedure: CYSTOSCOPY/URETEROSCOPY/HOLMIUM LASER/STENT EXCHANGE;  Surgeon: Malen Gauze, MD;  Location: [facility];  Service: Urology;  Laterality: Left;  NEEDS TOTAL 30 MIN   KNEE ARTHROSCOPY Right 2013 approx.   LEFT SHOULDER RECONSTRUCTION  1992   WIDE LOCAL EXCISION LEFT CHEST WALL  06/26/2006   malignant melanoma    Family History  Problem Relation Age of Onset   CVA Mother    Hypertension Mother    CAD Father        CAD with multiple stents and recents  cardiac arrest. HTN    Hypertension Brother     Medications- reviewed and updated Current Outpatient Medications  Medication Sig Dispense Refill   acetaminophen (TYLENOL) 500 MG tablet Take 1,000 mg by mouth every 6 (six) hours as needed for mild pain or moderate pain.     aspirin EC 81 MG tablet Take 81 mg by mouth daily.     budesonide (PULMICORT) 0.5 MG/2ML nebulizer solution as needed (shortness of breath).     celecoxib (CELEBREX) 200 MG capsule Take 200 mg by mouth daily as needed for pain.     colchicine 0.6 MG tablet Take 0.6 mg by mouth 2 (two) times daily as needed (gout).      Evolocumab (REPATHA SURECLICK) 140 MG/ML SOAJ Inject 140 mg into the skin every 14 (fourteen) days. 6 mL 3   febuxostat (ULORIC) 40 MG tablet Take 1 tablet by mouth daily.      fluticasone (FLONASE) 50 MCG/ACT nasal spray Place 2 sprays into both nostrils 2 (two) times daily as needed for allergies or rhinitis.     ibuprofen (ADVIL,MOTRIN) 200 MG tablet Take 400 mg by mouth every 6 (six) hours as needed for mild pain or moderate pain.     olmesartan (BENICAR) 40 MG tablet Take 40 mg by mouth every morning.   4   omeprazole (PRILOSEC OTC) 20 MG tablet Take 20 mg by mouth daily as needed.     PROAIR HFA 108 (90 Base) MCG/ACT inhaler Take 2 puffs by mouth every 6 (six) hours as needed for wheezing or shortness of breath.     rosuvastatin (CRESTOR) 20 MG tablet Take 1 tablet (20 mg total) by mouth daily. 90 tablet 3   sertraline (  ZOLOFT) 50 MG tablet Take 50 mg by mouth every morning.      No current facility-administered medications for this visit.    Allergies-reviewed and updated Allergies  Allergen Reactions   Grass Pollen(K-O-R-T-Swt Vern) Itching and Shortness Of Breath   Other Shortness Of Breath    Tree nuts---  sob   Dust Mite Extract     Social History   Socioeconomic History   Marital status: Married    Spouse name: Not on file   Number of children: Not on file   Years of education: Not on file   Highest education level: Not on file  Occupational History   Not on file  Tobacco Use   Smoking status: Never   Smokeless tobacco: Never  Vaping Use   Vaping status: Never Used  Substance and Sexual Activity   Alcohol use: Yes    Alcohol/week: 3.0 - 4.0 standard drinks of alcohol    Types: 3 - 4 Shots of liquor per week   Drug use: No   Sexual activity: Not on file    Comment: 2006--- vasectomy (office)  Other Topics Concern   Not on file  Social History Narrative   Not on file   Social Drivers of Health   Financial Resource Strain: Low Risk  (07/30/2021)   Received from [facility], [facility]   Overall Financial Resource Strain (CARDIA)    Difficulty of Paying Living Expenses: Not hard at all  Food Insecurity: No Food Insecurity  (07/30/2021)   Received from [facility], [facility]   Hunger Vital Sign    Worried About Running Out of Food in the Last Year: Never true    Ran Out of Food in the Last Year: Never true  Transportation Needs: No Transportation Needs (07/30/2021)   Received from [facility], [facility]   PRAPARE - Transportation    Lack of Transportation (Medical): No    Lack of Transportation (Non-Medical): No  Physical Activity: Sufficiently Active (07/30/2021)   Received from [facility], [facility]   Exercise Vital Sign    Days of Exercise per Week: 5 days    Minutes of Exercise per Session: 40 min  Stress: No Stress Concern Present (07/30/2021)   Received from [facility], [facility] - Occupational Stress Questionnaire    Feeling of Stress : Only a little  Social Connections: Unknown (03/24/2022)   Received from [facility], [facility]   Social Network    Social Network: Not on file          Objective:  Physical Exam: BP 122/80   Pulse 74   Temp (!) 97.1 F (36.2 C) (Temporal)   Ht 5\' 8"  (1.727 m)   Wt 217 lb 12.8 oz (98.8 kg)   SpO2 96%   BMI 33.12 kg/m   Gen: No acute distress, resting comfortably CV: Regular rate and rhythm with no murmurs appreciated Pulm: Normal work of breathing, clear to auscultation bilaterally with no crackles, wheezes, or rhonchi Neuro: Grossly normal, moves all extremities Psych: Normal affect and thought content      Kasir Hallenbeck M. Jimmey Ralph, MD 10/22/2023 12:02 PM

## 2023-10-22 NOTE — Assessment & Plan Note (Signed)
Following with cardiology for this.  He is on aspirin 81 mg daily, Crestor 20 mg daily, and Repatha 140 mg every 2 weeks.

## 2023-10-22 NOTE — Assessment & Plan Note (Signed)
Stable.  No recent flares.  Continue Uloric 40 mg daily.  We can recheck uric acid level next blood draw.

## 2023-10-22 NOTE — Assessment & Plan Note (Signed)
Blood pressure at goal today on olmesartan 40 mg daily.

## 2023-10-22 NOTE — Assessment & Plan Note (Signed)
Overall stable.  No recent flares.  Uses albuterol as needed.  Does not need refill today.  We did discuss referral to pulmonology for further evaluation however given current mild symptoms he would like to hold off on this for now.  He will let us know if he has any significant change in symptoms.

## 2023-10-27 ENCOUNTER — Other Ambulatory Visit (INDEPENDENT_AMBULATORY_CARE_PROVIDER_SITE_OTHER): Payer: 59

## 2023-10-27 DIAGNOSIS — R7989 Other specified abnormal findings of blood chemistry: Secondary | ICD-10-CM | POA: Diagnosis not present

## 2023-10-27 LAB — TESTOSTERONE: Testosterone: 378.65 ng/dL (ref 300.00–890.00)

## 2023-10-28 ENCOUNTER — Encounter: Payer: Self-pay | Admitting: Family Medicine

## 2023-10-28 NOTE — Progress Notes (Signed)
Testosterone levels are within goal range.  We can recheck again in a year or so but he should let us know if he has any symptoms.

## 2023-11-19 ENCOUNTER — Other Ambulatory Visit: Payer: Self-pay

## 2023-11-19 MED ORDER — ROSUVASTATIN CALCIUM 20 MG PO TABS: 20.0000 mg | ORAL_TABLET | Freq: Every day | ORAL | 3 refills | Status: AC

## 2023-11-30 ENCOUNTER — Encounter: Payer: Self-pay | Admitting: Family Medicine

## 2023-11-30 NOTE — Telephone Encounter (Signed)
Please advise 

## 2023-12-01 NOTE — Telephone Encounter (Signed)
Can we have him schedule an appointment to discuss?  Katina Degree. Jimmey Ralph, MD 12/01/2023 10:55 AM

## 2023-12-15 ENCOUNTER — Encounter: Payer: Self-pay | Admitting: Family Medicine

## 2023-12-15 ENCOUNTER — Telehealth (INDEPENDENT_AMBULATORY_CARE_PROVIDER_SITE_OTHER): Payer: 59 | Admitting: Family Medicine

## 2023-12-15 VITALS — Ht 68.0 in | Wt 210.0 lb

## 2023-12-15 DIAGNOSIS — I10 Essential (primary) hypertension: Secondary | ICD-10-CM | POA: Diagnosis not present

## 2023-12-15 DIAGNOSIS — M1990 Unspecified osteoarthritis, unspecified site: Secondary | ICD-10-CM

## 2023-12-15 DIAGNOSIS — T7020XS Unspecified effects of high altitude, sequela: Secondary | ICD-10-CM | POA: Diagnosis not present

## 2023-12-15 DIAGNOSIS — T7020XA Unspecified effects of high altitude, initial encounter: Secondary | ICD-10-CM | POA: Insufficient documentation

## 2023-12-15 MED ORDER — ACETAZOLAMIDE 250 MG PO TABS: 250.0000 mg | ORAL_TABLET | Freq: Two times a day (BID) | ORAL | 0 refills | Status: DC

## 2023-12-15 NOTE — Assessment & Plan Note (Signed)
 Stable on olmesartan 40 mg daily

## 2023-12-15 NOTE — Assessment & Plan Note (Signed)
Patient will be going to [place]altitude and request acetazolamide.  He has done well with this in the past.  Will send this in for him today.  We did discuss potential side effects.  He will let us know if he has any issues.

## 2023-12-15 NOTE — Progress Notes (Signed)
   Isaac Summers is a 53 y.o. male who presents today for a virtual office visit.  Assessment/Plan:  Chronic Problems Addressed Today: Altitude sickness Patient will be going to [place]altitude and request acetazolamide .  He has done well with this in the past.  Will send this in for him today.  We did discuss potential side effects.  He will let us  know if he has any issues.  HYPERTENSION Stable on olmesartan 40 mg daily.  Osteoarthritis Following with orthopedics.  Still going through rehab with right thumb surgery.  Uses NSAIDs as needed.  Symptoms are improving.     Subjective:  HPI:  See Assessment / plan for status of chronic conditions. He is here today to discuss concern for altitude sickness. He has had this before a few years ago.  Since then he has usually taken acetazolamide  prior to trips to high altitude which has done well.  He does have upcoming trip and he will be staying about 10,000 feet elevation.  He recommend refill on acetazolamide .  Previously has tolerated well.       Objective/Observations  Physical Exam: Gen: NAD, resting comfortably Pulm: Normal work of breathing Neuro: Grossly normal, moves all extremities Psych: Normal affect and thought content  Virtual Visit via Video   I connected with Isaac Summers on 12/15/23 at 11:00 AM EST by a video enabled telemedicine application and verified that I am speaking with the correct person using two identifiers. The limitations of evaluation and management by telemedicine and the availability of in person appointments were discussed. The patient expressed understanding and agreed to proceed.   Patient location: Home Provider location: [place] [employer] Persons participating in the virtual visit: Myself and Patient     Isaac CHRISTELLA. Kennyth, MD 12/15/2023 11:27 AM

## 2023-12-15 NOTE — Assessment & Plan Note (Signed)
Following with orthopedics.  Still going through rehab with right thumb surgery.  Uses NSAIDs as needed.  Symptoms are improving.

## 2024-01-07 ENCOUNTER — Encounter: Payer: Self-pay | Admitting: Family Medicine

## 2024-01-08 NOTE — Telephone Encounter (Signed)
 Spoke with patient advise to go to UC if not better  Stated still not better will go to UC if symptoms not improving

## 2024-01-11 ENCOUNTER — Ambulatory Visit: Payer: 59 | Admitting: Family Medicine

## 2024-01-11 ENCOUNTER — Telehealth: Admitting: Family

## 2024-01-11 VITALS — Ht 68.0 in | Wt 210.0 lb

## 2024-01-11 DIAGNOSIS — J4521 Mild intermittent asthma with (acute) exacerbation: Secondary | ICD-10-CM

## 2024-01-11 MED ORDER — PREDNISONE 10 MG PO TABS: ORAL_TABLET | ORAL | 0 refills | Status: DC

## 2024-01-11 NOTE — Progress Notes (Signed)
 MyChart Video Visit    Virtual Visit via Video Note   This format is felt to be most appropriate for this patient at this time. Physical exam was limited by quality of the video and audio technology used for the visit. CMA was able to get the patient set up on a video visit.  Patient location: Home. Patient and provider in visit Provider location: Office  I discussed the limitations of evaluation and management by telemedicine and the availability of in person appointments. The patient expressed understanding and agreed to proceed.  Visit Date: 01/11/2024  Today's healthcare provider: Dulce Sellar, NP     Subjective:   Patient ID: Isaac Summers, male    DOB: Mar 18, 1971, 53 y.o.   MRN: [id number]  Chief Complaint  Patient presents with   Cough    sx for 4d    HPI: Cough sx:  Pt reports having a dry cough and congestion  X 4 days; Temperature and body aches last week no symptoms now, he did not test for Covid or Flu. He reports whenever he has a viral cough he usually requires prednisone to start feeling better due to also having Asthma and uses an albuterol nebulizer prn.   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 Sending prednisone taper, reminded pt on use & SE. Do not take other NSAIDs while on this, ok to take Tylenol and other OTC cough or antihistamines prn. Advised on use of humidifier overnight. Call the office if sx are not improved after finishing the prednisone.  -     predniSONE; Take with breakfast:  6 tab day 1, 5 tab day 2-3, 4 tab day 4, 3 tab day 5-6  Dispense: 26 tablet; Refill: 0   Past Medical History:  Diagnosis Date   Allergic rhinitis    Arthritis    GERD (gastroesophageal reflux disease)    Gout    per pt as of 07-10-2017-- stable   History of kidney stones     History of malignant melanoma of skin 2007   2007  left chest wall s/p  WLE/  2008 left shoulder area   Hyperlipidemia    Hypertension    Left ureteral stone    Mild intermittent asthma    Urgency of urination    Wears glasses     Past Surgical History:  Procedure Laterality Date   CYSTOSCOPY W/ RETROGRADES Left 07/16/2017   Procedure: CYSTOSCOPY WITH RETROGRADE PYELOGRAM;  Surgeon: Malen Gauze, MD;  [facility];  Service: Urology;  Laterality: Left;   CYSTOSCOPY W/ URETERAL STENT PLACEMENT Left 06/19/2017   Procedure: CYSTOSCOPY WITH RETROGRADE PYELOGRAM/URETERAL STENT PLACEMENT;  Surgeon: Malen Gauze, MD;  Location: [facility];  Service: Urology;  Laterality: Left;   CYSTOSCOPY/URETEROSCOPY/HOLMIUM LASER/STENT PLACEMENT Left 07/16/2017   Procedure: CYSTOSCOPY/URETEROSCOPY/HOLMIUM LASER/STENT EXCHANGE;  Surgeon: Malen Gauze, MD;  Location: [facility];  Service: Urology;  Laterality: Left;  NEEDS TOTAL 30 MIN   KNEE ARTHROSCOPY Right 2013 approx.   LEFT SHOULDER RECONSTRUCTION  1992   WIDE LOCAL EXCISION LEFT CHEST WALL  06/26/2006   malignant melanoma    Outpatient Medications Prior to Visit  Medication Sig Dispense Refill   acetaminophen (TYLENOL) 500 MG tablet Take 1,000 mg by mouth every 6 (six) hours as needed for mild pain  or moderate pain.     acetaZOLAMIDE (DIAMOX) 250 MG tablet Take 1 tablet (250 mg total) by mouth 2 (two) times daily. 30 tablet 0   aspirin EC 81 MG tablet Take 81 mg by mouth daily.     budesonide (PULMICORT) 0.5 MG/2ML nebulizer solution as needed (shortness of breath).     celecoxib (CELEBREX) 200 MG capsule Take 200 mg by mouth daily as needed for pain.     colchicine 0.6 MG tablet Take 0.6 mg by mouth 2 (two) times daily as needed (gout).      Evolocumab (REPATHA SURECLICK) 140 MG/ML SOAJ Inject 140 mg into the skin every 14 (fourteen) days. 6 mL 3   febuxostat (ULORIC) 40 MG tablet Take 1 tablet by  mouth daily.     fluticasone (FLONASE) 50 MCG/ACT nasal spray Place 2 sprays into both nostrils 2 (two) times daily as needed for allergies or rhinitis.     ibuprofen (ADVIL,MOTRIN) 200 MG tablet Take 400 mg by mouth every 6 (six) hours as needed for mild pain or moderate pain.     olmesartan (BENICAR) 40 MG tablet Take 40 mg by mouth every morning.   4   omeprazole (PRILOSEC OTC) 20 MG tablet Take 20 mg by mouth daily as needed.     PROAIR HFA 108 (90 Base) MCG/ACT inhaler Take 2 puffs by mouth every 6 (six) hours as needed for wheezing or shortness of breath.     rosuvastatin (CRESTOR) 20 MG tablet Take 1 tablet (20 mg total) by mouth daily. 90 tablet 3   sertraline (ZOLOFT) 50 MG tablet Take 50 mg by mouth every morning.      No facility-administered medications prior to visit.    Allergies  Allergen Reactions   Grass Pollen(K-O-R-T-Swt Vern) Itching and Shortness Of Breath   Other Shortness Of Breath    Tree nuts---  sob   Dust Mite Extract        Objective:   Physical Exam Vitals and nursing note reviewed.  Constitutional:      General: Pt is not in acute distress.    Appearance: Normal appearance.  HENT:     Head: Normocephalic.  Pulmonary:     Effort: No respiratory distress.  Musculoskeletal:     Cervical back: Normal range of motion.  Skin:    General: Skin is dry.     Coloration: Skin is not pale.  Neurological:     Mental Status: Pt is alert and oriented to person, place, and time.  Psychiatric:        Mood and Affect: Mood normal.   Ht 5\' 8"  (1.727 m)   Wt 210 lb (95.3 kg)   BMI 31.93 kg/m   Wt Readings from Last 3 Encounters:  01/11/24 210 lb (95.3 kg)  12/15/23 210 lb (95.3 kg)  10/22/23 217 lb 12.8 oz (98.8 kg)      I discussed the assessment and treatment plan with the patient. The patient was provided an opportunity to ask questions and all were answered. The patient agreed with the plan and demonstrated an understanding of the instructions.    The patient was advised to call back or seek an in-person evaluation if the symptoms worsen or if the condition fails to improve as anticipated.  Dulce Sellar, NP [facility] [phone] (phone) [phone] (fax)  [facility]

## 2024-01-18 ENCOUNTER — Encounter: Payer: Self-pay | Admitting: Family Medicine

## 2024-01-18 NOTE — Telephone Encounter (Signed)
 Okay to order for MMR titer though recommend he check with insurance to make sure it is covered first.

## 2024-04-19 ENCOUNTER — Encounter: Payer: Self-pay | Admitting: Family Medicine

## 2024-04-19 ENCOUNTER — Other Ambulatory Visit: Payer: Self-pay

## 2024-04-19 NOTE — Telephone Encounter (Signed)
 Last refill done on 01/07/2017 By historical provider

## 2024-04-19 NOTE — Telephone Encounter (Signed)
 Ok to refill.  Isaac Summers. Daneil Dunker, MD 04/19/2024 2:13 PM

## 2024-04-20 ENCOUNTER — Telehealth: Payer: Self-pay | Admitting: *Deleted

## 2024-04-20 MED ORDER — SERTRALINE HCL 50 MG PO TABS: 50.0000 mg | ORAL_TABLET | ORAL | 3 refills | Status: AC

## 2024-04-20 NOTE — Telephone Encounter (Addendum)
 Copied from CRM [id number]. Topic: Clinical - Lab/Test Results >> Apr 19, 2024 11:04 AM Marlan Silva wrote: Reason for CRM: Patient requesting measle titers, and is asking for the lab order to be placed. Patient would like a call back once roder is placed so that way the lab appointment can be set up as well. Patient can be reached at [phone].   Please advise  Isaac Summers,RMA

## 2024-04-20 NOTE — Telephone Encounter (Signed)
 Ok with me. Please place any necessary orders.

## 2024-04-21 ENCOUNTER — Other Ambulatory Visit: Payer: Self-pay | Admitting: *Deleted

## 2024-04-21 ENCOUNTER — Encounter: Payer: Self-pay | Admitting: Family Medicine

## 2024-04-21 DIAGNOSIS — Z789 Other specified health status: Secondary | ICD-10-CM

## 2024-04-21 NOTE — Telephone Encounter (Signed)
 Unable to reach pt , lvm requesting call back to schedule a lab appt .

## 2024-04-21 NOTE — Telephone Encounter (Signed)
 Please schedule a lab appt  Future lab order  [facility]

## 2024-04-25 ENCOUNTER — Other Ambulatory Visit

## 2024-04-25 DIAGNOSIS — Z789 Other specified health status: Secondary | ICD-10-CM

## 2024-04-26 LAB — MEASLES/MUMPS/RUBELLA IMMUNITY
Mumps IgG: 43.1 [AU]/ml
Rubella: >33 {index}
Rubeola IgG: >300 [AU]/ml

## 2024-04-27 ENCOUNTER — Ambulatory Visit: Payer: Self-pay | Admitting: Family Medicine

## 2024-04-27 NOTE — Progress Notes (Signed)
 Labs show that he has full immunity to measles, mumps, and rubella.  He does not need any boosters at this time.

## 2024-05-11 ENCOUNTER — Other Ambulatory Visit (HOSPITAL_COMMUNITY): Payer: Self-pay

## 2024-05-11 ENCOUNTER — Telehealth: Payer: Self-pay | Admitting: Pharmacy Technician

## 2024-05-11 NOTE — Telephone Encounter (Signed)
 Pharmacy Patient Advocate Encounter   Received notification from Onbase that prior authorization for REPATHA  140MG  is required/requested.   Insurance verification completed.   The patient is insured through [employer] [facility] .   Per test claim: PA required; PA submitted to above mentioned insurance via CoverMyMeds Key/confirmation #/EOC [id number] Status is pending

## 2024-05-11 NOTE — Telephone Encounter (Signed)
 Pharmacy Patient Advocate Encounter  Received notification from [employer] [facility] that Prior Authorization for repatha  140mg  has been APPROVED from 05/11/24 to 05/11/25   PA #/Case ID/Reference #: [id number]    Sent mychart

## 2024-08-02 ENCOUNTER — Encounter: Payer: Self-pay | Admitting: Family Medicine

## 2024-08-02 ENCOUNTER — Other Ambulatory Visit (HOSPITAL_BASED_OUTPATIENT_CLINIC_OR_DEPARTMENT_OTHER): Payer: Self-pay

## 2024-08-02 MED ORDER — FLUZONE 0.5 ML IM SUSY
0.5000 mL | PREFILLED_SYRINGE | Freq: Once | INTRAMUSCULAR | 0 refills | Status: AC
  Filled 2024-08-02: qty 0.5, 1d supply, fill #0

## 2024-08-02 MED ORDER — COMIRNATY 30 MCG/0.3ML IM SUSY
0.3000 mL | PREFILLED_SYRINGE | Freq: Once | INTRAMUSCULAR | 0 refills | Status: AC
  Filled 2024-08-02: qty 0.3, 1d supply, fill #0

## 2024-08-15 ENCOUNTER — Encounter: Payer: Self-pay | Admitting: Family

## 2024-08-16 MED ORDER — COLCHICINE 0.6 MG PO TABS: 0.6000 mg | ORAL_TABLET | Freq: Two times a day (BID) | ORAL | 5 refills | Status: AC | PRN

## 2024-08-17 ENCOUNTER — Encounter: Payer: Self-pay | Admitting: Family Medicine

## 2024-08-17 ENCOUNTER — Ambulatory Visit: Payer: Self-pay

## 2024-08-17 ENCOUNTER — Ambulatory Visit: Admitting: Family Medicine

## 2024-08-17 VITALS — BP 136/80 | HR 78 | Temp 98.2°F | Wt 217.6 lb

## 2024-08-17 DIAGNOSIS — J209 Acute bronchitis, unspecified: Secondary | ICD-10-CM | POA: Diagnosis not present

## 2024-08-17 DIAGNOSIS — R059 Cough, unspecified: Secondary | ICD-10-CM

## 2024-08-17 LAB — POC COVID19 BINAXNOW: SARS Coronavirus 2 Ag: NEGATIVE

## 2024-08-17 NOTE — Progress Notes (Signed)
 Established Patient Office Visit  Subjective   Patient ID: Isaac Summers, male    DOB: 1971-04-06  Age: 53 y.o. MRN: [id number]  Chief Complaint  Patient presents with   Cough   Generalized Body Aches   Fatigue    HPI   Isaac Summers is seen with acute respiratory illness.  He states he had cough, mostly nonproductive, for at least a week.  Over the weekend developed some bodyaches and fatigue.  No fever.  No sore throat.  States he generally gets acute bronchial illness about once a year.  Does sometimes require steroids to get over this.  Does have reported history of asthma but sounds like more mild intermittent.  Does not take any regular controlling steroid inhalers.  No known sick contacts.  Denies any recent fever.  No chest pain or shortness of breath.  Past Medical History:  Diagnosis Date   Allergic rhinitis    Arthritis    GERD (gastroesophageal reflux disease)    Gout    per pt as of 07-10-2017-- stable   History of kidney stones    History of malignant melanoma of skin 2007   2007  left chest wall s/p  WLE/  2008 left shoulder area   Hyperlipidemia    Hypertension    Left ureteral stone    Mild intermittent asthma    Urgency of urination    Wears glasses    Past Surgical History:  Procedure Laterality Date   CYSTOSCOPY W/ RETROGRADES Left 07/16/2017   Procedure: CYSTOSCOPY WITH RETROGRADE PYELOGRAM;  Surgeon: Sherrilee Belvie CROME, MD;  Location: [facility];  Service: Urology;  Laterality: Left;   CYSTOSCOPY W/ URETERAL STENT PLACEMENT Left 06/19/2017   Procedure: CYSTOSCOPY WITH RETROGRADE PYELOGRAM/URETERAL STENT PLACEMENT;  Surgeon: Sherrilee Belvie CROME, MD;  Location: [facility];  Service: Urology;  Laterality: Left;   CYSTOSCOPY/URETEROSCOPY/HOLMIUM LASER/STENT PLACEMENT Left 07/16/2017   Procedure: CYSTOSCOPY/URETEROSCOPY/HOLMIUM LASER/STENT EXCHANGE;  Surgeon: Sherrilee Belvie CROME, MD;  Location: [facility];  Service: Urology;   Laterality: Left;  NEEDS TOTAL 30 MIN   KNEE ARTHROSCOPY Right 2013 approx.   LEFT SHOULDER RECONSTRUCTION  1992   WIDE LOCAL EXCISION LEFT CHEST WALL  06/26/2006   malignant melanoma    reports that he has never smoked. He has never used smokeless tobacco. He reports current alcohol use of about 3.0 - 4.0 standard drinks of alcohol per week. He reports that he does not use drugs. family history includes CAD in his father; CVA in his mother; Hypertension in his brother and mother. Allergies  Allergen Reactions   Grass Pollen(K-O-R-T-Swt Vern) Itching and Shortness Of Breath   Other Shortness Of Breath    Tree nuts---  sob   Dust Mite Extract     Review of Systems  Constitutional:  Positive for malaise/fatigue. Negative for fever.  HENT:  Negative for sinus pain and sore throat.   Respiratory:  Positive for cough.   Cardiovascular:  Negative for chest pain.      Objective:     BP 136/80   Pulse 78   Temp 98.2 F (36.8 C) (Oral)   Wt 217 lb 9.6 oz (98.7 kg)   SpO2 95%   BMI 33.09 kg/m  BP Readings from Last 3 Encounters:  08/17/24 136/80  10/22/23 122/80  09/24/23 132/76   Wt Readings from Last 3 Encounters:  08/17/24 217 lb 9.6 oz (98.7 kg)  01/11/24 210 lb (95.3 kg)  12/15/23 210 lb (95.3 kg)  Physical Exam Vitals reviewed.  Constitutional:      General: He is not in acute distress.    Appearance: He is not ill-appearing.  HENT:     Right Ear: Tympanic membrane normal.     Left Ear: Tympanic membrane normal.     Mouth/Throat:     Mouth: Mucous membranes are moist.     Pharynx: Oropharynx is clear. No posterior oropharyngeal erythema.  Cardiovascular:     Rate and Rhythm: Normal rate and regular rhythm.  Pulmonary:     Effort: Pulmonary effort is normal.     Breath sounds: Normal breath sounds. No wheezing or rales.  Musculoskeletal:     Cervical back: Neck supple.  Lymphadenopathy:     Cervical: No cervical adenopathy.  Neurological:     Mental  Status: He is alert.      Results for orders placed or performed in visit on 08/17/24  POC COVID-19  Result Value Ref Range   SARS Coronavirus 2 Ag Negative Negative      The ASCVD Risk score (Arnett DK, et al., 2019) failed to calculate for the following reasons:   The valid total cholesterol range is 130 to 320 mg/dL    Assessment & Plan:   Problem List Items Addressed This Visit   None Visit Diagnoses       Cough, unspecified type    -  Primary   Relevant Orders   POC COVID-19 (Completed)     Patient presents as a work and with 1 week history of nonproductive cough.  He had about 4 days of some bodyaches and fatigue.  COVID test negative.  Suspect viral origin.  Nonfocal exam.  Has had some wheezing intermittently the past but none noted at this time.  -Observe for now.  He states he has left over a course of prednisone  if he develops any wheezing - Follow-up promptly for any fever, increased shortness of breath, or other concerns  No follow-ups on file.    Wolm Scarlet, MD

## 2024-08-17 NOTE — Telephone Encounter (Signed)
 Patient reports cough, congestion, body aches and fatigue. Patient requesting to be scheduled for today. Scheduled for 1 PM today at alternative clinic. Patient given information on location. Patient verbalized understanding.   FYI Only or Action Required?: FYI only for provider.  Patient was last seen in primary care on 01/11/2024 by Lucius Krabbe, NP.  Called Nurse Triage reporting Generalized Body Aches and Chest Congestion.  Symptoms began 2 days ago.  Interventions attempted: Rest, hydration, or home remedies.  Symptoms are: unchanged.  Triage Disposition: See PCP When Office is Open (Within 3 Days)  Patient/caregiver understands and will follow disposition?: Yes  Copied from CRM #[id number]. Topic: Clinical - Red Word Triage >> Aug 17, 2024 11:46 AM [place] J wrote: [facility] that prompted transfer to Nurse Triage: Patient has body aches and congestion. He's also fatigued. Reason for Disposition  [1] Also has allergy symptoms (e.g., itchy eyes, clear nasal discharge, postnasal drip) AND [2] they are acting up  Answer Assessment - Initial Assessment Questions 1. ONSET: When did the cough begin?      Started a couple of days ago 2. SEVERITY: How bad is the cough today?      mild 3. SPUTUM: Describe the color of your sputum (e.g., none, dry cough; clear, white, yellow, green)     dry 4. HEMOPTYSIS: Are you coughing up any blood? If Yes, ask: How much? (e.g., flecks, streaks, tablespoons, etc.)     no 5. DIFFICULTY BREATHING: Are you having difficulty breathing? If Yes, ask: How bad is it? (e.g., mild, moderate, severe)      no 6. FEVER: Do you have a fever? If Yes, ask: What is your temperature, how was it measured, and when did it start?     no 7. CARDIAC HISTORY: Do you have any history of heart disease? (e.g., heart attack, congestive heart failure)      no 8. LUNG HISTORY: Do you have any history of lung disease?  (e.g., pulmonary embolus, asthma,  emphysema)     yes 9. PE RISK FACTORS: Do you have a history of blood clots? (or: recent major surgery, recent prolonged travel, bedridden)     no 10. OTHER SYMPTOMS: Do you have any other symptoms? (e.g., runny nose, wheezing, chest pain)       Congestion, fatigue 12. TRAVEL: Have you traveled out of the country in the last month? (e.g., travel history, exposures)       no  Protocols used: Cough - Acute Non-Productive-A-AH

## 2024-08-17 NOTE — Telephone Encounter (Signed)
 Appt today

## 2024-09-09 ENCOUNTER — Other Ambulatory Visit: Payer: Self-pay | Admitting: Pharmacist Clinician (PhC)/ Clinical Pharmacy Specialist

## 2024-09-09 MED ORDER — REPATHA SURECLICK 140 MG/ML ~~LOC~~ SOAJ: 140.0000 mg | SUBCUTANEOUS | 3 refills | Status: AC

## 2024-10-12 ENCOUNTER — Ambulatory Visit: Admitting: Internal Medicine

## 2024-10-14 ENCOUNTER — Telehealth: Payer: Self-pay

## 2024-10-14 ENCOUNTER — Other Ambulatory Visit: Payer: Self-pay

## 2024-10-14 DIAGNOSIS — Z0000 Encounter for general adult medical examination without abnormal findings: Secondary | ICD-10-CM

## 2024-10-14 NOTE — Telephone Encounter (Signed)
 Copied from CRM #[id number]. Topic: Clinical - Request for Lab/Test Order >> Oct 14, 2024  9:39 AM Laymon HERO wrote: Reason for CRM: Patient needing to get an order for lab work before physical on 12/12  Ok to place future lab orders for pt upcoming CPE scheduled 10/21/24?

## 2024-10-14 NOTE — Telephone Encounter (Signed)
 Future lab orders placed for pt upcoming CPE, called pt and lvm advising lab orders placed and he can call our office for fasting lab only appt to have completed first of next week.

## 2024-10-14 NOTE — Telephone Encounter (Signed)
 Please schedule a lab appt prior to CPE  Patient has to do fasting labs

## 2024-10-14 NOTE — Telephone Encounter (Signed)
 Ok with me. Please place any necessary orders.

## 2024-10-17 NOTE — Telephone Encounter (Signed)
LVM to schedule lab only visit

## 2024-10-18 ENCOUNTER — Other Ambulatory Visit

## 2024-10-18 DIAGNOSIS — Z0000 Encounter for general adult medical examination without abnormal findings: Secondary | ICD-10-CM

## 2024-10-18 LAB — CBC WITH DIFFERENTIAL/PLATELET
Basophils Absolute: 0 K/uL (ref 0.0–0.1)
Basophils Relative: 0.7 % (ref 0.0–3.0)
Eosinophils Absolute: 0.1 K/uL (ref 0.0–0.7)
Eosinophils Relative: 2 % (ref 0.0–5.0)
HCT: 46.3 % (ref 39.0–52.0)
Hemoglobin: 15.4 g/dL (ref 13.0–17.0)
Lymphocytes Relative: 37.4 % (ref 12.0–46.0)
Lymphs Abs: 1.5 K/uL (ref 0.7–4.0)
MCHC: 33.3 g/dL (ref 30.0–36.0)
MCV: 93.3 fl (ref 78.0–100.0)
Monocytes Absolute: 0.3 K/uL (ref 0.1–1.0)
Monocytes Relative: 8.1 % (ref 3.0–12.0)
Neutro Abs: 2.1 K/uL (ref 1.4–7.7)
Neutrophils Relative %: 51.8 % (ref 43.0–77.0)
Platelets: 201 K/uL (ref 150.0–400.0)
RBC: 4.96 Mil/uL (ref 4.22–5.81)
RDW: 14.1 % (ref 11.5–15.5)
WBC: 4 K/uL (ref 4.0–10.5)

## 2024-10-18 LAB — COMPREHENSIVE METABOLIC PANEL WITH GFR
ALT: 26 U/L (ref 0–53)
AST: 22 U/L (ref 0–37)
Albumin: 4.8 g/dL (ref 3.5–5.2)
Alkaline Phosphatase: 52 U/L (ref 39–117)
BUN: 17 mg/dL (ref 6–23)
CO2: 29 meq/L (ref 19–32)
Calcium: 9.8 mg/dL (ref 8.4–10.5)
Chloride: 103 meq/L (ref 96–112)
Creatinine, Ser: 1.08 mg/dL (ref 0.40–1.50)
GFR: 77.55 mL/min (ref >60.00)
Glucose, Bld: 91 mg/dL (ref 70–99)
Potassium: 4.5 meq/L (ref 3.5–5.1)
Sodium: 142 meq/L (ref 135–145)
Total Bilirubin: 0.6 mg/dL (ref 0.2–1.2)
Total Protein: 7.3 g/dL (ref 6.0–8.3)

## 2024-10-18 LAB — PSA: PSA: 0.72 ng/mL (ref 0.10–4.00)

## 2024-10-18 LAB — LIPID PANEL
Cholesterol: 121 mg/dL (ref 0–200)
HDL: 69.4 mg/dL (ref >39.00)
LDL Cholesterol: 36 mg/dL (ref 0–99)
NonHDL: 51.91
Total CHOL/HDL Ratio: 2
Triglycerides: 79 mg/dL (ref 0.0–149.0)
VLDL: 15.8 mg/dL (ref 0.0–40.0)

## 2024-10-18 LAB — HEMOGLOBIN A1C: Hgb A1c MFr Bld: 5.8 % (ref 4.6–6.5)

## 2024-10-18 LAB — TSH: TSH: 1.43 u[IU]/mL (ref 0.35–5.50)

## 2024-10-19 ENCOUNTER — Ambulatory Visit: Payer: Self-pay | Admitting: Family Medicine

## 2024-10-19 NOTE — Progress Notes (Signed)
 Great news!  Blood work is all normal.  We can discuss more at upcoming office visit.

## 2024-10-21 ENCOUNTER — Encounter: Payer: Self-pay | Admitting: Family Medicine

## 2024-10-21 ENCOUNTER — Ambulatory Visit: Admitting: Family Medicine

## 2024-10-21 VITALS — BP 130/80 | HR 94 | Temp 97.5°F | Ht 68.0 in | Wt 215.6 lb

## 2024-10-21 DIAGNOSIS — K219 Gastro-esophageal reflux disease without esophagitis: Secondary | ICD-10-CM

## 2024-10-21 DIAGNOSIS — J4520 Mild intermittent asthma, uncomplicated: Secondary | ICD-10-CM

## 2024-10-21 DIAGNOSIS — M109 Gout, unspecified: Secondary | ICD-10-CM

## 2024-10-21 DIAGNOSIS — I2510 Atherosclerotic heart disease of native coronary artery without angina pectoris: Secondary | ICD-10-CM

## 2024-10-21 DIAGNOSIS — J302 Other seasonal allergic rhinitis: Secondary | ICD-10-CM | POA: Insufficient documentation

## 2024-10-21 DIAGNOSIS — M1990 Unspecified osteoarthritis, unspecified site: Secondary | ICD-10-CM

## 2024-10-21 DIAGNOSIS — I10 Essential (primary) hypertension: Secondary | ICD-10-CM

## 2024-10-21 DIAGNOSIS — R7989 Other specified abnormal findings of blood chemistry: Secondary | ICD-10-CM

## 2024-10-21 DIAGNOSIS — H9193 Unspecified hearing loss, bilateral: Secondary | ICD-10-CM

## 2024-10-21 DIAGNOSIS — F411 Generalized anxiety disorder: Secondary | ICD-10-CM

## 2024-10-21 DIAGNOSIS — Z23 Encounter for immunization: Secondary | ICD-10-CM

## 2024-10-21 DIAGNOSIS — Z0001 Encounter for general adult medical examination with abnormal findings: Secondary | ICD-10-CM

## 2024-10-21 DIAGNOSIS — E785 Hyperlipidemia, unspecified: Secondary | ICD-10-CM

## 2024-10-21 LAB — TESTOSTERONE: Testosterone: 212.62 ng/dL — ABNORMAL LOW (ref 300.00–890.00)

## 2024-10-21 NOTE — Assessment & Plan Note (Signed)
 Stable on olmesartan 40 mg daily

## 2024-10-21 NOTE — Assessment & Plan Note (Addendum)
 Most recent lipid panel at goal on Crestor  20 mg every other day and Repatha  140 mg every 2 weeks.

## 2024-10-21 NOTE — Assessment & Plan Note (Signed)
 Stable on Zoloft  50 mg daily.

## 2024-10-21 NOTE — Assessment & Plan Note (Signed)
 Recheck testosterone  today.  He may be interested in testosterone  placement.

## 2024-10-21 NOTE — Progress Notes (Signed)
 Chief Complaint:  Isaac Summers is a 53 y.o. male who presents today for his annual comprehensive physical exam.    Assessment/Plan:  Decreased Hearing  Patient with more ear sensitivity with exposure to loud noises.  Will place referral to audiology.  Chronic Problems Addressed Today: Hyperlipidemia Most recent lipid panel at goal on Crestor  20 mg every other day and Repatha  140 mg every 2 weeks.  Low testosterone  Recheck testosterone  today.  He may be interested in testosterone  placement.  Generalized anxiety disorder Stable on Zoloft  50 mg daily  HYPERTENSION Stable on olmesartan 40 mg daily.  Asthma Stable on albuterol  as needed.  Gout Stable.  No recent flares.  On Uloric  40 mg daily.  CAD (coronary artery disease) With cardiology.  On aspirin  81 mg daily, Crestor  20 mg daily, and Repatha  140 mg every 2 weeks.  Osteoarthritis He is having more right knee issues but overall symptoms are currently manageable.  May be planning on having a knee replacement surgery at some point in the next 1 to 2 years.  He will let us  know if he needs a referral.  GERD (gastroesophageal reflux disease) On over-the-counter Meprazole 20 mg daily as needed.  Seasonal allergies Stable on antihistamines as needed.  Preventative Healthcare: Pneumonia vaccine given today. UpToDate on other screenings. He will get shingles vaccine soon.   Patient Counseling(The following topics were reviewed and/or handout was given):  -Nutrition: Stressed importance of moderation in sodium/caffeine intake, saturated fat and cholesterol, caloric balance, sufficient intake of fresh fruits, vegetables, and fiber.  -Stressed the importance of regular exercise.   -Substance Abuse: Discussed cessation/primary prevention of tobacco, alcohol, or other drug use; driving or other dangerous activities under the influence; availability of treatment for abuse.   -Injury prevention: Discussed safety belts, safety  helmets, smoke detector, smoking near bedding or upholstery.   -Sexuality: Discussed sexually transmitted diseases, partner selection, use of condoms, avoidance of unintended pregnancy and contraceptive alternatives.   -Dental health: Discussed importance of regular tooth brushing, flossing, and dental visits.  -Health maintenance and immunizations reviewed. Please refer to Health maintenance section.  Return to care in 1 year for next preventative visit.     Subjective:  HPI:  He has no acute complaints today. Patient is here today for his annual physical.  See assessment / plan for status of chronic conditions.  Discussed the use of AI scribe software for clinical note transcription with the patient, who gave verbal consent to proceed.  History of Present Illness Isaac Summers is a 53 year old male who presents for an annual physical exam.  He experiences increased sensitivity to loud noises and is considering seeing an audiologist. He is unsure if the sensitivity is in one or both ears.  His testosterone  level was checked last year and was in the normal range at that time. He experiences occasional fatigue but is unsure if it is related to testosterone  levels. He exercises regularly, including lifting weights, walking, and Pilates, and maintains a balanced routine.  He has a history of allergies to grass and takes Benadryl  nightly to manage symptoms, as it causes drowsiness when taken during the day. He has tried second-generation antihistamines like Zyrtec, Claritin, and Allegra, which work fine.  He has been taking rosuvastatin  every other day due to experiencing fatigue. His LDL was 36 on the last check, and he previously had total cholesterol under 100 when taking both Repatha  and rosuvastatin  daily.  He mentions a future need for right  knee replacement due to ongoing pain and a history of gout affecting the knee.       10/21/2024    8:44 AM  Depression screen PHQ 2/9   Decreased Interest 0  Down, Depressed, Hopeless 0  PHQ - 2 Score 0    Health Maintenance Due  Topic Date Due   Pneumococcal Vaccine: 50+ Years (1 of 2 - PCV) Never done     ROS: Per HPI, otherwise a complete review of systems was negative.   PMH:  The following were reviewed and entered/updated in epic: Past Medical History:  Diagnosis Date   Allergic rhinitis    Arthritis    GERD (gastroesophageal reflux disease)    Gout    per pt as of 07-10-2017-- stable   History of kidney stones    History of malignant melanoma of skin 2007   2007  left chest wall s/p  WLE/  2008 left shoulder area   Hyperlipidemia    Hypertension    Left ureteral stone    Mild intermittent asthma    Urgency of urination    Wears glasses    Patient Active Problem List   Diagnosis Date Noted   Seasonal allergies 10/21/2024   Altitude sickness 12/15/2023   Generalized anxiety disorder 10/22/2023   CAD (coronary artery disease) 10/22/2023   Osteoarthritis 10/22/2023   Asthma 10/22/2023   Gout 10/22/2023   GERD (gastroesophageal reflux disease) 10/22/2023   Low testosterone  10/22/2023   Ureteral calculus 06/19/2017   Hyperlipidemia 11/06/2008   HYPERTENSION 11/06/2008   MELANOMA, HX OF 11/06/2008   Past Surgical History:  Procedure Laterality Date   CYSTOSCOPY W/ RETROGRADES Left 07/16/2017   Procedure: CYSTOSCOPY WITH RETROGRADE PYELOGRAM;  Surgeon: Sherrilee Belvie CROME, MD;  Location: [facility];  Service: Urology;  Laterality: Left;   CYSTOSCOPY W/ URETERAL STENT PLACEMENT Left 06/19/2017   Procedure: CYSTOSCOPY WITH RETROGRADE PYELOGRAM/URETERAL STENT PLACEMENT;  Surgeon: Sherrilee Belvie CROME, MD;  Location: [facility];  Service: Urology;  Laterality: Left;   CYSTOSCOPY/URETEROSCOPY/HOLMIUM LASER/STENT PLACEMENT Left 07/16/2017   Procedure: CYSTOSCOPY/URETEROSCOPY/HOLMIUM LASER/STENT EXCHANGE;  Surgeon: Sherrilee Belvie CROME, MD;  Location: [facility];  Service:  Urology;  Laterality: Left;  NEEDS TOTAL 30 MIN   KNEE ARTHROSCOPY Right 2013 approx.   LEFT SHOULDER RECONSTRUCTION  1992   WIDE LOCAL EXCISION LEFT CHEST WALL  06/26/2006   malignant melanoma    Family History  Problem Relation Age of Onset   CVA Mother    Hypertension Mother    CAD Father        CAD with multiple stents and recents  cardiac arrest. HTN    Hypertension Brother     Medications- reviewed and updated Current Outpatient Medications  Medication Sig Dispense Refill   acetaminophen  (TYLENOL ) 500 MG tablet Take 1,000 mg by mouth every 6 (six) hours as needed for mild pain or moderate pain.     aspirin  EC 81 MG tablet Take 81 mg by mouth daily.     budesonide (PULMICORT) 0.5 MG/2ML nebulizer solution as needed (shortness of breath).     celecoxib (CELEBREX) 200 MG capsule Take 200 mg by mouth daily as needed for pain.     colchicine  0.6 MG tablet Take 1 tablet (0.6 mg total) by mouth 2 (two) times daily as needed (gout). 30 tablet 5   Evolocumab  (REPATHA  SURECLICK) 140 MG/ML SOAJ Inject 140 mg into the skin every 14 (fourteen) days. 6 mL 3   febuxostat  (ULORIC ) 40 MG tablet  Take 1 tablet by mouth daily.     fluticasone (FLONASE) 50 MCG/ACT nasal spray Place 2 sprays into both nostrils 2 (two) times daily as needed for allergies or rhinitis.     ibuprofen (ADVIL,MOTRIN) 200 MG tablet Take 400 mg by mouth every 6 (six) hours as needed for mild pain or moderate pain.     olmesartan (BENICAR) 40 MG tablet Take 40 mg by mouth every morning.   4   omeprazole (PRILOSEC OTC) 20 MG tablet Take 20 mg by mouth daily as needed.     PROAIR  HFA 108 (90 Base) MCG/ACT inhaler Take 2 puffs by mouth every 6 (six) hours as needed for wheezing or shortness of breath.     rosuvastatin  (CRESTOR ) 20 MG tablet Take 1 tablet (20 mg total) by mouth daily. 90 tablet 3   sertraline  (ZOLOFT ) 50 MG tablet Take 1 tablet (50 mg total) by mouth every morning. 90 tablet 3   No current facility-administered  medications for this visit.    Allergies-reviewed and updated Allergies[1]  Social History   Socioeconomic History   Marital status: Married    Spouse name: Not on file   Number of children: Not on file   Years of education: Not on file   Highest education level: Master's degree (e.g., MA, MS, MEng, MEd, MSW, MBA)  Occupational History   Not on file  Tobacco Use   Smoking status: Never   Smokeless tobacco: Never  Vaping Use   Vaping status: Never Used  Substance and Sexual Activity   Alcohol use: Yes    Alcohol/week: 3.0 - 4.0 standard drinks of alcohol    Types: 3 - 4 Shots of liquor per week   Drug use: No   Sexual activity: Not on file    Comment: 2006--- vasectomy (office)  Other Topics Concern   Not on file  Social History Narrative   Not on file   Social Drivers of Health   Tobacco Use: Low Risk (10/21/2024)   Patient History    Smoking Tobacco Use: Never    Smokeless Tobacco Use: Never    Passive Exposure: Not on file  Financial Resource Strain: Low Risk (01/11/2024)   Overall Financial Resource Strain (CARDIA)    Difficulty of Paying Living Expenses: Not hard at all  Food Insecurity: No Food Insecurity (01/11/2024)   Hunger Vital Sign    Worried About Running Out of Food in the Last Year: Never true    Ran Out of Food in the Last Year: Never true  Transportation Needs: No Transportation Needs (01/11/2024)   PRAPARE - Administrator, Civil Service (Medical): No    Lack of Transportation (Non-Medical): No  Physical Activity: Sufficiently Active (01/11/2024)   Exercise Vital Sign    Days of Exercise per Week: 5 days    Minutes of Exercise per Session: 40 min  Stress: Stress Concern Present (01/11/2024)   [employer] of Occupational Health - Occupational Stress Questionnaire    Feeling of Stress : To some extent  Social Connections: Moderately Integrated (01/11/2024)   Social Connection and Isolation Panel    Frequency of Communication with Friends  and Family: Once a week    Frequency of Social Gatherings with Friends and Family: Once a week    Attends Religious Services: 1 to 4 times per year    Active Member of [employer] or Organizations: Yes    Attends Banker Meetings: More than 4 times per year  Marital Status: Married  Depression (PHQ2-9): Low Risk (10/21/2024)   Depression (PHQ2-9)    PHQ-2 Score: 0  Alcohol Screen: Low Risk (01/11/2024)   Alcohol Screen    Last Alcohol Screening Score (AUDIT): 5  Housing: Low Risk (01/11/2024)   Housing Stability Vital Sign    Unable to Pay for Housing in the Last Year: No    Number of Times Moved in the Last Year: 0    Homeless in the Last Year: No  Utilities: Not on file  Health Literacy: Not on file        Objective:  Physical Exam: BP 130/80   Pulse 94   Temp (!) 97.5 F (36.4 C) (Temporal)   Ht 5' 8 (1.727 m)   Wt 215 lb 9.6 oz (97.8 kg)   SpO2 (!) 75%   BMI 32.78 kg/m   Body mass index is 32.78 kg/m. Wt Readings from Last 3 Encounters:  10/21/24 215 lb 9.6 oz (97.8 kg)  08/17/24 217 lb 9.6 oz (98.7 kg)  01/11/24 210 lb (95.3 kg)   Gen: NAD, resting comfortably HEENT: TMs normal bilaterally. OP clear. No thyromegaly noted.  CV: RRR with no murmurs appreciated Pulm: NWOB, CTAB with no crackles, wheezes, or rhonchi GI: Normal bowel sounds present. Soft, Nontender, Nondistended. MSK: no edema, cyanosis, or clubbing noted Skin: warm, dry Neuro: CN2-12 grossly intact. Strength 5/5 in upper and lower extremities. Reflexes symmetric and intact bilaterally.  Psych: Normal affect and thought content     Asianae Minkler M. Kennyth, MD 10/21/2024 9:22 AM     [1]  Allergies Allergen Reactions   Grass Pollen(K-O-R-T-Swt Vern) Itching and Shortness Of Breath   Other Shortness Of Breath    Tree nuts---  sob   Dust Mite Extract

## 2024-10-21 NOTE — Assessment & Plan Note (Signed)
 Stable.  No recent flares.  On Uloric  40 mg daily.

## 2024-10-21 NOTE — Assessment & Plan Note (Signed)
 On over-the-counter Meprazole 20 mg daily as needed.

## 2024-10-21 NOTE — Assessment & Plan Note (Signed)
 He is having more right knee issues but overall symptoms are currently manageable.  May be planning on having a knee replacement surgery at some point in the next 1 to 2 years.  He will let us  know if he needs a referral.

## 2024-10-21 NOTE — Patient Instructions (Signed)
 It was very nice to see you today!  VISIT SUMMARY: Today, you had your annual physical exam. We discussed your sensitivity to loud noises, testosterone  levels, allergies, cholesterol management, knee pain, and general health maintenance. You received a pneumonia vaccine and were referred to an audiologist for further evaluation of your hearing sensitivity.  YOUR PLAN: HYPERLIPIDEMIA: Your cholesterol levels are being managed with rosuvastatin  every other day due to fatigue. -Continue taking rosuvastatin  every other day.  LOW TESTOSTERONE : Your previous testosterone  levels were normal, but you experience occasional fatigue. -We discussed the potential benefits and risks of testosterone  replacement therapy. -A morning testosterone  level test has been ordered.  ESSENTIAL HYPERTENSION: Your blood pressure is well-controlled. -Continue with your current management plan.  GOUT: You have chronic pain in your right knee due to gout and are planning a knee replacement surgery. -Plan for knee replacement surgery in the next couple of years.  ALLERGIC RHINITIS: You have allergies to grass and manage symptoms with Benadryl  at night to avoid daytime drowsiness. -Continue taking Benadryl  at night.  HEARING SENSITIVITY: You experience sensitivity to loud noises and have not yet seen an audiologist. -You have been referred to an audiologist for a hearing evaluation.  GENERAL HEALTH MAINTENANCE: You are up to date on your colonoscopy and tetanus vaccinations. Your PSA, A1c, and cholesterol levels are normal. -You received a pneumonia vaccine today. -The shingles vaccine was deferred due to potential side effects.  Return in about 1 year (around 10/21/2025) for Annual Physical.   Take care, Dr Kennyth  PLEASE NOTE:  If you had any lab tests, please let us  know if you have not heard back within a few days. You may see your results on mychart before we have a chance to review them but we will give you  a call once they are reviewed by us .   If we ordered any referrals today, please let us  know if you have not heard from their office within the next week.   If you had any urgent prescriptions sent in today, please check with the pharmacy within an hour of our visit to make sure the prescription was transmitted appropriately.   Please try these tips to maintain a healthy lifestyle:  Eat at least 3 REAL meals and 1-2 snacks per day.  Aim for no more than 5 hours between eating.  If you eat breakfast, please do so within one hour of getting up.   Each meal should contain half fruits/vegetables, one quarter protein, and one quarter carbs (no bigger than a computer mouse)  Cut down on sweet beverages. This includes juice, soda, and sweet tea.   Drink at least 1 glass of water with each meal and aim for at least 8 glasses per day  Exercise at least 150 minutes every week.    Preventive Care 52-23 Years Old, Male Preventive care refers to lifestyle choices and visits with your health care provider that can promote health and wellness. Preventive care visits are also called wellness exams. What can I expect for my preventive care visit? Counseling During your preventive care visit, your health care provider may ask about your: Medical history, including: Past medical problems. Family medical history. Current health, including: Emotional well-being. Home life and relationship well-being. Sexual activity. Lifestyle, including: Alcohol, nicotine or tobacco, and drug use. Access to firearms. Diet, exercise, and sleep habits. Safety issues such as seatbelt and bike helmet use. Sunscreen use. Work and work astronomer. Physical exam Your health care provider will check  your: Height and weight. These may be used to calculate your BMI (body mass index). BMI is a measurement that tells if you are at a healthy weight. Waist circumference. This measures the distance around your waistline. This  measurement also tells if you are at a healthy weight and may help predict your risk of certain diseases, such as type 2 diabetes and high blood pressure. Heart rate and blood pressure. Body temperature. Skin for abnormal spots. What immunizations do I need?  Vaccines are usually given at various ages, according to a schedule. Your health care provider will recommend vaccines for you based on your age, medical history, and lifestyle or other factors, such as travel or where you work. What tests do I need? Screening Your health care provider may recommend screening tests for certain conditions. This may include: Lipid and cholesterol levels. Diabetes screening. This is done by checking your blood sugar (glucose) after you have not eaten for a while (fasting). Hepatitis B test. Hepatitis C test. HIV (human immunodeficiency virus) test. STI (sexually transmitted infection) testing, if you are at risk. Lung cancer screening. Prostate cancer screening. Colorectal cancer screening. Talk with your health care provider about your test results, treatment options, and if necessary, the need for more tests. Follow these instructions at home: Eating and drinking  Eat a diet that includes fresh fruits and vegetables, whole grains, lean protein, and low-fat dairy products. Take vitamin and mineral supplements as recommended by your health care provider. Do not drink alcohol if your health care provider tells you not to drink. If you drink alcohol: Limit how much you have to 0-2 drinks a day. Know how much alcohol is in your drink. In the [place] one drink equals one 12 oz bottle of beer (355 mL), one 5 oz glass of wine (148 mL), or one 1 oz glass of hard liquor (44 mL). Lifestyle Brush your teeth every morning and night with fluoride toothpaste. Floss one time each day. Exercise for at least 30 minutes 5 or more days each week. Do not use any products that contain nicotine or tobacco. These products  include cigarettes, chewing tobacco, and vaping devices, such as e-cigarettes. If you need help quitting, ask your health care provider. Do not use drugs. If you are sexually active, practice safe sex. Use a condom or other form of protection to prevent STIs. Take aspirin  only as told by your health care provider. Make sure that you understand how much to take and what form to take. Work with your health care provider to find out whether it is safe and beneficial for you to take aspirin  daily. Find healthy ways to manage stress, such as: Meditation, yoga, or listening to music. Journaling. Talking to a trusted person. Spending time with friends and family. Minimize exposure to UV radiation to reduce your risk of skin cancer. Safety Always wear your seat belt while driving or riding in a vehicle. Do not drive: If you have been drinking alcohol. Do not ride with someone who has been drinking. When you are tired or distracted. While texting. If you have been using any mind-altering substances or drugs. Wear a helmet and other protective equipment during sports activities. If you have firearms in your house, make sure you follow all gun safety procedures. What's next? Go to your health care provider once a year for an annual wellness visit. Ask your health care provider how often you should have your eyes and teeth checked. Stay up to date on all  vaccines. This information is not intended to replace advice given to you by your health care provider. Make sure you discuss any questions you have with your health care provider. Document Revised: 04/24/2021 Document Reviewed: 04/24/2021 Elsevier Patient Education  2024 [employer].

## 2024-10-21 NOTE — Assessment & Plan Note (Signed)
 With cardiology.  On aspirin  81 mg daily, Crestor  20 mg daily, and Repatha  140 mg every 2 weeks.

## 2024-10-21 NOTE — Assessment & Plan Note (Signed)
 Stable on antihistamines as needed.

## 2024-10-21 NOTE — Assessment & Plan Note (Signed)
Stable on albuterol as needed. °

## 2024-10-24 ENCOUNTER — Ambulatory Visit: Payer: Self-pay | Admitting: Family Medicine

## 2024-10-24 DIAGNOSIS — R7989 Other specified abnormal findings of blood chemistry: Secondary | ICD-10-CM

## 2024-10-24 NOTE — Progress Notes (Signed)
 Testosterone  is low.  Recommend he come back to recheck this if he is interested in replacement therapy.

## 2024-11-01 ENCOUNTER — Ambulatory Visit: Payer: Self-pay | Admitting: Family Medicine

## 2024-11-01 ENCOUNTER — Other Ambulatory Visit

## 2024-11-01 DIAGNOSIS — R7989 Other specified abnormal findings of blood chemistry: Secondary | ICD-10-CM | POA: Diagnosis not present

## 2024-11-01 LAB — TESTOSTERONE: Testosterone: 255.79 ng/dL — ABNORMAL LOW (ref 300.00–890.00)

## 2024-11-01 NOTE — Progress Notes (Signed)
 Testosterone  is confirmed low on repeat. Recommend he schedule an appointment here to discuss replacement options if he is interested.

## 2024-11-11 ENCOUNTER — Ambulatory Visit: Attending: Internal Medicine | Admitting: Internal Medicine

## 2024-11-11 ENCOUNTER — Encounter: Payer: Self-pay | Admitting: Internal Medicine

## 2024-11-11 VITALS — BP 148/86 | HR 71 | Ht 68.0 in | Wt 216.2 lb

## 2024-11-11 DIAGNOSIS — I2510 Atherosclerotic heart disease of native coronary artery without angina pectoris: Secondary | ICD-10-CM

## 2024-11-11 DIAGNOSIS — I10 Essential (primary) hypertension: Secondary | ICD-10-CM | POA: Diagnosis not present

## 2024-11-11 DIAGNOSIS — E785 Hyperlipidemia, unspecified: Secondary | ICD-10-CM | POA: Diagnosis not present

## 2024-11-11 NOTE — Patient Instructions (Signed)
 Medication Instructions:  Your physician recommends that you continue on your current medications as directed. Please refer to the Current Medication list given to you today.   *If you need a refill on your cardiac medications before your next appointment, please call your pharmacy*  Lab Work: NONE    Testing/Procedures: NONE   Follow-Up: At [facility], you and your health needs are our priority.  As part of our continuing mission to provide you with exceptional heart care, our providers are all part of one team.  This team includes your primary Cardiologist (physician) and Advanced Practice Providers or APPs (Physician Assistants and Nurse Practitioners) who all work together to provide you with the care you need, when you need it.  Your next appointment:   1 year(s)  Provider:   Stanly DELENA Leavens, MD
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
## 2024-11-16 ENCOUNTER — Ambulatory Visit: Admitting: Audiologist

## 2024-11-17 ENCOUNTER — Ambulatory Visit: Admitting: Family Medicine

## 2024-11-29 ENCOUNTER — Ambulatory Visit: Admitting: Audiologist

## 2024-12-08 ENCOUNTER — Ambulatory Visit: Admitting: Audiologist

## 2025-10-24 ENCOUNTER — Encounter: Admitting: Family Medicine
